# Patient Record
Sex: Female | Born: 1960 | Race: White | Hispanic: No | Marital: Married | Smoking: Never smoker
Health system: Southern US, Community
[De-identification: ages and names within clinical notes are randomized; demographics above are authoritative.]

## PROBLEM LIST (undated history)

## (undated) DIAGNOSIS — R030 Elevated blood-pressure reading, without diagnosis of hypertension: Secondary | ICD-10-CM

## (undated) DIAGNOSIS — E079 Disorder of thyroid, unspecified: Secondary | ICD-10-CM

## (undated) DIAGNOSIS — N915 Oligomenorrhea, unspecified: Secondary | ICD-10-CM

## (undated) DIAGNOSIS — N852 Hypertrophy of uterus: Secondary | ICD-10-CM

## (undated) DIAGNOSIS — E039 Hypothyroidism, unspecified: Secondary | ICD-10-CM

## (undated) DIAGNOSIS — E109 Type 1 diabetes mellitus without complications: Secondary | ICD-10-CM

## (undated) DIAGNOSIS — I10 Essential (primary) hypertension: Secondary | ICD-10-CM

## (undated) DIAGNOSIS — E785 Hyperlipidemia, unspecified: Secondary | ICD-10-CM

## (undated) DIAGNOSIS — N841 Polyp of cervix uteri: Secondary | ICD-10-CM

## (undated) DIAGNOSIS — R011 Cardiac murmur, unspecified: Secondary | ICD-10-CM

## (undated) DIAGNOSIS — D259 Leiomyoma of uterus, unspecified: Secondary | ICD-10-CM

## (undated) HISTORY — DX: Leiomyoma of uterus, unspecified: D25.9

## (undated) HISTORY — DX: Hypertrophy of uterus: N85.2

## (undated) HISTORY — DX: Cardiac murmur, unspecified: R01.1

## (undated) HISTORY — DX: Oligomenorrhea, unspecified: N91.5

## (undated) HISTORY — PX: MYOMECTOMY ABDOMINAL APPROACH: SUR870

## (undated) HISTORY — DX: Elevated blood-pressure reading, without diagnosis of hypertension: R03.0

## (undated) HISTORY — DX: Disorder of thyroid, unspecified: E07.9

## (undated) HISTORY — DX: Type 1 diabetes mellitus without complications: E10.9

## (undated) HISTORY — DX: Polyp of cervix uteri: N84.1

## (undated) HISTORY — DX: Hypothyroidism, unspecified: E03.9

## (undated) HISTORY — DX: Hyperlipidemia, unspecified: E78.5

## (undated) HISTORY — DX: Essential (primary) hypertension: I10

---

## 1998-02-05 ENCOUNTER — Other Ambulatory Visit: Admission: RE | Admit: 1998-02-05 | Discharge: 1998-02-05 | Payer: Self-pay | Admitting: Obstetrics and Gynecology

## 1998-02-25 ENCOUNTER — Ambulatory Visit (HOSPITAL_COMMUNITY): Admission: RE | Admit: 1998-02-25 | Discharge: 1998-02-25 | Payer: Self-pay | Admitting: Obstetrics and Gynecology

## 1998-02-25 ENCOUNTER — Encounter: Payer: Self-pay | Admitting: Obstetrics and Gynecology

## 1998-04-30 ENCOUNTER — Inpatient Hospital Stay (HOSPITAL_COMMUNITY): Admission: RE | Admit: 1998-04-30 | Discharge: 1998-05-02 | Payer: Self-pay | Admitting: Obstetrics and Gynecology

## 1998-12-02 ENCOUNTER — Other Ambulatory Visit: Admission: RE | Admit: 1998-12-02 | Discharge: 1998-12-02 | Payer: Self-pay | Admitting: Obstetrics and Gynecology

## 1998-12-02 ENCOUNTER — Encounter (INDEPENDENT_AMBULATORY_CARE_PROVIDER_SITE_OTHER): Payer: Self-pay | Admitting: Specialist

## 1999-02-17 ENCOUNTER — Other Ambulatory Visit: Admission: RE | Admit: 1999-02-17 | Discharge: 1999-02-17 | Payer: Self-pay | Admitting: Obstetrics and Gynecology

## 1999-07-20 ENCOUNTER — Ambulatory Visit (HOSPITAL_COMMUNITY): Admission: RE | Admit: 1999-07-20 | Discharge: 1999-07-20 | Payer: Self-pay | Admitting: Obstetrics and Gynecology

## 1999-07-20 ENCOUNTER — Encounter: Payer: Self-pay | Admitting: Obstetrics and Gynecology

## 1999-09-01 ENCOUNTER — Encounter: Payer: Self-pay | Admitting: Obstetrics and Gynecology

## 1999-09-01 ENCOUNTER — Ambulatory Visit (HOSPITAL_COMMUNITY): Admission: RE | Admit: 1999-09-01 | Discharge: 1999-09-01 | Payer: Self-pay | Admitting: Obstetrics and Gynecology

## 1999-10-07 ENCOUNTER — Encounter: Payer: Self-pay | Admitting: Obstetrics and Gynecology

## 1999-10-07 ENCOUNTER — Ambulatory Visit (HOSPITAL_COMMUNITY): Admission: RE | Admit: 1999-10-07 | Discharge: 1999-10-07 | Payer: Self-pay | Admitting: Obstetrics and Gynecology

## 1999-11-12 ENCOUNTER — Encounter: Payer: Self-pay | Admitting: Obstetrics & Gynecology

## 1999-11-12 ENCOUNTER — Ambulatory Visit (HOSPITAL_COMMUNITY): Admission: RE | Admit: 1999-11-12 | Discharge: 1999-11-12 | Payer: Self-pay | Admitting: Obstetrics & Gynecology

## 1999-11-19 ENCOUNTER — Ambulatory Visit (HOSPITAL_COMMUNITY): Admission: RE | Admit: 1999-11-19 | Discharge: 1999-11-19 | Payer: Self-pay | Admitting: Obstetrics and Gynecology

## 1999-11-19 ENCOUNTER — Encounter: Payer: Self-pay | Admitting: Obstetrics and Gynecology

## 1999-11-24 ENCOUNTER — Encounter (INDEPENDENT_AMBULATORY_CARE_PROVIDER_SITE_OTHER): Payer: Self-pay | Admitting: Specialist

## 1999-11-24 ENCOUNTER — Inpatient Hospital Stay (HOSPITAL_COMMUNITY): Admission: AD | Admit: 1999-11-24 | Discharge: 1999-11-27 | Payer: Self-pay | Admitting: Obstetrics and Gynecology

## 2000-02-10 ENCOUNTER — Other Ambulatory Visit: Admission: RE | Admit: 2000-02-10 | Discharge: 2000-02-10 | Payer: Self-pay | Admitting: Obstetrics and Gynecology

## 2000-02-24 ENCOUNTER — Encounter: Payer: Self-pay | Admitting: Obstetrics and Gynecology

## 2000-02-24 ENCOUNTER — Ambulatory Visit (HOSPITAL_COMMUNITY): Admission: RE | Admit: 2000-02-24 | Discharge: 2000-02-24 | Payer: Self-pay | Admitting: Obstetrics and Gynecology

## 2001-04-05 ENCOUNTER — Other Ambulatory Visit: Admission: RE | Admit: 2001-04-05 | Discharge: 2001-04-05 | Payer: Self-pay | Admitting: Obstetrics and Gynecology

## 2001-04-17 ENCOUNTER — Ambulatory Visit (HOSPITAL_COMMUNITY): Admission: RE | Admit: 2001-04-17 | Discharge: 2001-04-17 | Payer: Self-pay | Admitting: Obstetrics and Gynecology

## 2001-04-17 ENCOUNTER — Encounter: Payer: Self-pay | Admitting: Obstetrics and Gynecology

## 2002-04-10 ENCOUNTER — Other Ambulatory Visit: Admission: RE | Admit: 2002-04-10 | Discharge: 2002-04-10 | Payer: Self-pay | Admitting: Obstetrics and Gynecology

## 2002-04-23 ENCOUNTER — Ambulatory Visit (HOSPITAL_COMMUNITY): Admission: RE | Admit: 2002-04-23 | Discharge: 2002-04-23 | Payer: Self-pay | Admitting: Obstetrics and Gynecology

## 2002-04-23 ENCOUNTER — Encounter: Payer: Self-pay | Admitting: Obstetrics and Gynecology

## 2003-05-07 ENCOUNTER — Other Ambulatory Visit: Admission: RE | Admit: 2003-05-07 | Discharge: 2003-05-07 | Payer: Self-pay | Admitting: Obstetrics and Gynecology

## 2003-05-09 ENCOUNTER — Ambulatory Visit (HOSPITAL_COMMUNITY): Admission: RE | Admit: 2003-05-09 | Discharge: 2003-05-09 | Payer: Self-pay | Admitting: Obstetrics and Gynecology

## 2004-05-07 ENCOUNTER — Other Ambulatory Visit: Admission: RE | Admit: 2004-05-07 | Discharge: 2004-05-07 | Payer: Self-pay | Admitting: Obstetrics and Gynecology

## 2004-05-13 ENCOUNTER — Ambulatory Visit (HOSPITAL_COMMUNITY): Admission: RE | Admit: 2004-05-13 | Discharge: 2004-05-13 | Payer: Self-pay | Admitting: Obstetrics and Gynecology

## 2005-05-04 ENCOUNTER — Other Ambulatory Visit: Admission: RE | Admit: 2005-05-04 | Discharge: 2005-05-04 | Payer: Self-pay | Admitting: Obstetrics and Gynecology

## 2005-05-12 ENCOUNTER — Ambulatory Visit (HOSPITAL_COMMUNITY): Admission: RE | Admit: 2005-05-12 | Discharge: 2005-05-12 | Payer: Self-pay | Admitting: Obstetrics and Gynecology

## 2006-05-30 ENCOUNTER — Ambulatory Visit (HOSPITAL_COMMUNITY): Admission: RE | Admit: 2006-05-30 | Discharge: 2006-05-30 | Payer: Self-pay | Admitting: Obstetrics and Gynecology

## 2007-06-15 ENCOUNTER — Ambulatory Visit (HOSPITAL_COMMUNITY): Admission: RE | Admit: 2007-06-15 | Discharge: 2007-06-15 | Payer: Self-pay | Admitting: Obstetrics and Gynecology

## 2008-07-19 ENCOUNTER — Ambulatory Visit (HOSPITAL_COMMUNITY): Admission: RE | Admit: 2008-07-19 | Discharge: 2008-07-19 | Payer: Self-pay | Admitting: Obstetrics and Gynecology

## 2009-07-22 ENCOUNTER — Ambulatory Visit (HOSPITAL_COMMUNITY): Admission: RE | Admit: 2009-07-22 | Discharge: 2009-07-22 | Payer: Self-pay | Admitting: Obstetrics and Gynecology

## 2009-11-13 ENCOUNTER — Observation Stay (HOSPITAL_COMMUNITY): Admission: EM | Admit: 2009-11-13 | Discharge: 2009-11-14 | Payer: Self-pay | Admitting: Emergency Medicine

## 2009-11-13 ENCOUNTER — Ambulatory Visit: Payer: Self-pay | Admitting: Family Medicine

## 2009-11-14 ENCOUNTER — Encounter: Payer: Self-pay | Admitting: Family Medicine

## 2010-02-02 ENCOUNTER — Ambulatory Visit: Payer: Self-pay | Admitting: Cardiology

## 2010-02-02 ENCOUNTER — Ambulatory Visit: Payer: Self-pay

## 2010-02-02 DIAGNOSIS — I059 Rheumatic mitral valve disease, unspecified: Secondary | ICD-10-CM | POA: Insufficient documentation

## 2010-02-02 DIAGNOSIS — R55 Syncope and collapse: Secondary | ICD-10-CM

## 2010-02-03 ENCOUNTER — Ambulatory Visit: Payer: Self-pay | Admitting: Cardiology

## 2010-02-04 ENCOUNTER — Telehealth: Payer: Self-pay | Admitting: Cardiology

## 2010-02-11 ENCOUNTER — Ambulatory Visit: Payer: Self-pay | Admitting: Cardiology

## 2010-02-11 ENCOUNTER — Ambulatory Visit: Payer: Self-pay

## 2010-02-11 DIAGNOSIS — R9431 Abnormal electrocardiogram [ECG] [EKG]: Secondary | ICD-10-CM

## 2010-02-11 LAB — CONVERTED CEMR LAB
Direct LDL: 115.1 mg/dL
HDL: 61.4 mg/dL (ref >39.00)
VLDL: 11 mg/dL (ref 0.0–40.0)

## 2010-02-17 ENCOUNTER — Telehealth (INDEPENDENT_AMBULATORY_CARE_PROVIDER_SITE_OTHER): Payer: Self-pay | Admitting: Radiology

## 2010-02-18 ENCOUNTER — Ambulatory Visit: Payer: Self-pay

## 2010-02-18 ENCOUNTER — Encounter: Payer: Self-pay | Admitting: Cardiology

## 2010-02-18 ENCOUNTER — Encounter (HOSPITAL_COMMUNITY)
Admission: RE | Admit: 2010-02-18 | Discharge: 2010-03-24 | Payer: Self-pay | Source: Home / Self Care | Attending: Cardiology | Admitting: Cardiology

## 2010-03-26 NOTE — Procedures (Signed)
Summary: INFO  INFO   Imported By: Mirna Mires 02/09/2010 12:41:22  _____________________________________________________________________  External Attachment:    Type:   Image     Comment:   External Document

## 2010-03-26 NOTE — Progress Notes (Signed)
Summary: monitor ressults  Phone Note Outgoing Call   Call placed by: Katina Dung, RN, BSN,  February 04, 2010 6:10 PM Call placed to: Patient Summary of Call: monitor resutls  Follow-up for Phone Call        Dr Shirlee Latch  reviewed monitor done 02/02/10-02/04/10--PACs and PVCs--mild nocturnal bradycardia, no pauses-nothing to explain syncope--I attempted to notify pt and got pt 's answering machine Luana Shu --[facility] Katina Dung, RN, BSN  February 06, 2010 10:58 AM --pt notified of monitor results

## 2010-03-26 NOTE — Progress Notes (Signed)
Summary: Nuc Pre-Procedure  Phone Note Outgoing Call Call back at Home Phone [phone]   Call placed by: Henrine Screws Call placed to: Patient Reason for Call: Confirm/change Appt Summary of Call: Left message with information on Myoview Information Sheet (see scanned document for details).      Nuclear Med Background Indications for Stress Test: Evaluation for Ischemia  Indications Comments: 9/25/11[facility]- Syncopal episode.Negative enzymes.  History: Echo, GXT  History Comments: PAC's/ PVC's 9/11- Echo- EF= 55-60% 02/11/10- GXT- ST changes and I-LBBB with increased HR.  Symptoms: Syncope    Nuclear Pre-Procedure Cardiac Risk Factors: Family History - CAD, Hypertension, IDDM Type 1 Height (in): 63.5

## 2010-03-26 NOTE — Assessment & Plan Note (Signed)
Summary: Cardiology Nuclear Testing  Nuclear Med Background Indications for Stress Test: Evaluation for Ischemia  Indications Comments: 02/11/10  Abnormal GXT  History: Echo, GXT  History Comments: 9/11Echo:EF=55-60%; 12/21/11GXT:exercise induced ILBBB and NS ST changes  Symptoms: Light-Headedness, Syncope  Symptoms Comments: 11/16/09 syncope:admitted to [facility], negative enzymes.   Nuclear Pre-Procedure Cardiac Risk Factors: Family History - CAD, Hypertension, IDDM Type 1, Lipids Caffeine/Decaff Intake: none NPO After: 11:00 PM Lungs: Clear.  O2 Sat 100% on RA. IV 0.9% NS with Angio Cath: 22g     IV Site: R Hand IV Started by: Cathlyn Parsons, RN Chest Size (in) 34     Cup Size B     Height (in): 63.5 Weight (lb): 130 BMI: 22.75  Nuclear Med Study 1 or 2 day study:  1 day     Stress Test Type:  Eugenie Birks Reading MD:  Olga Millers, MD     Referring MD:  Marca Ancona, MD Resting Radionuclide:  Technetium 97m Tetrofosmin     Resting Radionuclide Dose:  11.0 mCi  Stress Radionuclide:  Technetium 62m Tetrofosmin     Stress Radionuclide Dose:  33.0 mCi   Stress Protocol   Lexiscan: 0.4 mg   Stress Test Technologist:  Rea [employer], CMA-N     Nuclear Technologist:  Domenic Polite, CNMT  Rest Procedure  Myocardial perfusion imaging was performed at rest 45 minutes following the intravenous administration of Technetium 41m Tetrofosmin.  Stress Procedure  The patient received IV Lexiscan 0.4 mg over 15-seconds.  Technetium 26m Tetrofosmin injected at 30-seconds.  There were nonspecific T-wave changes with infusion.  Quantitative spect images were obtained after a 45 minute delay.  QPS Raw Data Images:  Acquisition technically good; normal left ventricular size. Stress Images:  There is decreased uptake in the anterior wall. Rest Images:  There is decreased uptake in the anterior wall. Subtraction (SDS):  No evidence of ischemia. Transient Ischemic Dilatation:  1.01  (Normal  <1.22)  Lung/Heart Ratio:  0.26  (Normal <0.45)  Quantitative Gated Spect Images QGS EDV:  71 ml QGS ESV:  21 ml QGS EF:  71 % QGS cine images:  Normal wall motion.   Overall Impression  Exercise Capacity: Lexiscan with no exercise. BP Response: Normal blood pressure response. Clinical Symptoms: No chest pain ECG Impression: No significant ST segment change suggestive of ischemia. Overall Impression: Normal lexiscan nuclear study with mild soft tissue attenuation but no ischemia.  Appended Document: Cardiology Nuclear Testing probably normal study  Appended Document: Cardiology Nuclear Testing adv Mr. Twaddell of normal results. Claris Gladden, RN, BSN

## 2010-03-26 NOTE — Assessment & Plan Note (Signed)
Summary: NP6/DX:SYNCOPE/BRADICARDIA/LG   Visit Type:  new pt  CC:  no complaints.  History of Present Illness: 50 yo with history of Type I diabetes and HTN presents for evaluation of syncope.  Patient had had 2 episodes of syncope prior to the most recent episode.  Both of these episodes were in the setting of getting blood drawn.  In 9/11, she was at a dentist's office with her daughter.  She went to the bathroom while she was waiting and had a large bowel movement.  5 minutes or so after this, she noted significant lightheadedness.  She got up and went outside for some air but felt no better.  She then went back toward the bathroom but passed out before she got there and slumped along the wall onto the floor.  EMS was called -- her heart rate was in the 40s.  While EMS was there, she became nauseated and vomited.  She then passed out again.  She was taken to the ER.  Blood glucose was 106 not long before the event and was normal when she got to the ER.  She was admitted and had an unremarkable hospital stay.  No events on telemetry.  Echo with normal LV systolic function and mild MR.  She has been doing well since discharge with on recurrent syncope or lightheadedness.  She does 30 minutes of cardiovascular exercise daily (Stairmaster, elliptical, runs on treadmill) with no chest pain or tightness and no dyspnea.  She has never felt palpitations.    ECG: NSR, suspect normal  Labs (9/11): Cardiac enzymes negative x 3, K 3.9, creatinine 1.08, TSH normal  Current Medications (verified): 1)  Humalog 100 Unit/ml Soln (Insulin Lispro (Human)) .... Pump 2)  Benazepril-Hydrochlorothiazide 10-12.5 Mg Tabs (Benazepril-Hydrochlorothiazide) .... Two Times A Day 3)  Synthroid 112 Mcg Tabs (Levothyroxine Sodium) .... Once Daily 4)  Aspirin 81 Mg Tabs (Aspirin) .... Once Daily 5)  Fenoprofen Calcium 600 Mg Tabs (Fenoprofen Calcium) .... Once Daily 6)  One-A-Day Extras Antioxidant  Caps (Multiple  Vitamins-Minerals) .... Once Daily  Allergies (verified): No Known Drug Allergies  Past History:  Past Medical History: 1. Syncopal episode x 3: 2 episodes associated with blood draws. One episode in 9/11 after bowel movement. Echo (9/11): EF 55-60%, probably mild eccentric mitral regurgitation.  2. Type 1 diabetes since childhood.  3. Hypertension.  4. Hypothyroid.  5. History of myomectomy  Family History: Reviewed history from 01/30/2010 and no changes required.  Father deceased from MI at age 70, type 1 diabetes   mellitus.  Sister with type 1 diabetes mellitus, hypertension,   hypothyroidism.  Mother is healthy.      Social History: Reviewed history from 01/30/2010 and no changes required. The patient is married, has one child, currently works as a Passenger transport manager at [employer].  No tobacco, no alcohol, and no  illicit drug use.  Exercises routinely.      Review of Systems       All systems reviewed and negative except as per HPI.   Vital Signs:  Patient profile:   50 year old female Height:      63.5 inches Weight:      133 pounds BMI:     23.27 Pulse rate:   75 / minute BP sitting:   155 / 77  (left arm)  Vitals Entered By: Caralee Ates CMA (February 02, 2010 9:31 AM)  Physical Exam  General:  Well developed, well nourished, in no acute distress. Head:  normocephalic and atraumatic Nose:  no deformity, discharge, inflammation, or lesions Mouth:  Teeth, gums and palate normal. Oral mucosa normal. Neck:  Neck supple, no JVD. No masses, thyromegaly or abnormal cervical nodes. Lungs:  Clear bilaterally to auscultation and percussion. Heart:  Non-displaced PMI, chest non-tender; regular rate and rhythm, S1, S2 without murmurs, rubs or gallops. Carotid upstroke normal, no bruit.  Pedals normal pulses. No edema, no varicosities. Abdomen:  Bowel sounds positive; abdomen soft and non-tender without masses, organomegaly, or hernias noted. No  hepatosplenomegaly. Msk:  Back normal, normal gait. Muscle strength and tone normal. Extremities:  No clubbing or cyanosis. Neurologic:  Alert and oriented x 3. Skin:  Intact without lesions or rashes. Psych:  Normal affect.   Impression & Recommendations:  Problem # 1:  SYNCOPE (ICD-780.2) Patient had a syncopal episode soon after a large bowel movement and again shortly afterwards with emesis.  She had 2 syncopal episodes prior to these in the setting of blood draws.  Inpatient workup was unremarkable.  Telemetry was normal, Echo was unremarkable except for mild mitral regurgitation, cardiac enzymes were negative.  This episode seems most likely to be vasovagal.  However, given her cardiac risk factors (HTN, longstanding DM), I think that getting an ETT to make sure that there is no evidence for ischemia would be reasonable.  I will also have her get a 48 hour holter monitor to assess for arrhythmias.  If this workup is negative, no further evaluation.    Problem # 2:  MITRAL VALVE DISORDERS (ICD-424.0) MR appeared mild on echo in 9/11 but was eccentric, so makes evaluation more difficult.  Would repeat echo in 2 years to make sure that there is no progression.   Problem # 3:  BLOOD PRESSURE BP is running high today but she checks it regularly at home and it has always run 110s-130s.  She will continue to monitor at home.   We will get fasting lipids (has not been done for about 2 years).   Other Orders: Treadmill (Treadmill) Holter Monitor (Holter Monitor)  Patient Instructions: 1)  Your physician recommends that you return for a FASTING lipid profile: 780.2  2)  Your physician has recommended that you wear a holter monitor.  Holter monitors are medical devices that record the heart's electrical activity. Doctors most often use these monitors to diagnose arrhythmias. Arrhythmias are problems with the speed or rhythm of the heartbeat. The monitor is a small, portable device. You can wear  one while you do your normal daily activities. This is usually used to diagnose what is causing palpitations/syncope (passing out). 48 HOUR MONITOR 3)  Your physician has requested that you have an exercise tolerance test.  For further information please visit [email or web address]  Please also follow instruction sheet, as given.    Appended Document: NP6/DX:SYNCOPE/BRADICARDIA/LG faxed to Dr Santiago Bumpers [phone]

## 2010-05-07 LAB — CBC
HCT: 37.4 % (ref 36.0–46.0)
HCT: 44.3 % (ref 36.0–46.0)
Hemoglobin: 12.9 g/dL (ref 12.0–15.0)
MCH: 30.4 pg (ref 26.0–34.0)
MCH: 31.1 pg (ref 26.0–34.0)
MCHC: 34.5 g/dL (ref 30.0–36.0)
MCV: 88 fL (ref 78.0–100.0)
MCV: 90 fL (ref 78.0–100.0)
Platelets: 177 K/uL (ref 150–400)
Platelets: 186 10*3/uL (ref 150–400)
RBC: 4.25 MIL/uL (ref 3.87–5.11)
RDW: 13 % (ref 11.5–15.5)
RDW: 13.1 % (ref 11.5–15.5)
WBC: 8.4 K/uL (ref 4.0–10.5)

## 2010-05-07 LAB — CARDIAC PANEL(CRET KIN+CKTOT+MB+TROPI)
CK, MB: 1.3 ng/mL (ref 0.3–4.0)
Total CK: 62 U/L (ref 7–177)
Troponin I: 0.01 ng/mL (ref 0.00–0.06)

## 2010-05-07 LAB — POCT CARDIAC MARKERS
Myoglobin, poc: 69.3 ng/mL (ref 12–200)
Troponin i, poc: <0.05 ng/mL (ref 0.00–0.09)

## 2010-05-07 LAB — BASIC METABOLIC PANEL
BUN: 14 mg/dL (ref 6–23)
CO2: 26 mEq/L (ref 19–32)
Chloride: 105 mEq/L (ref 96–112)
GFR calc Af Amer: 57 mL/min — ABNORMAL LOW (ref >60)
GFR calc Af Amer: >60 mL/min (ref >60)
GFR calc non Af Amer: 54 mL/min — ABNORMAL LOW (ref >60)
Glucose, Bld: 231 mg/dL — ABNORMAL HIGH (ref 70–99)
Potassium: 3.9 mEq/L (ref 3.5–5.1)
Sodium: 137 mEq/L (ref 135–145)

## 2010-05-07 LAB — GLUCOSE, CAPILLARY
Glucose-Capillary: 107 mg/dL — ABNORMAL HIGH (ref 70–99)
Glucose-Capillary: 224 mg/dL — ABNORMAL HIGH (ref 70–99)
Glucose-Capillary: 243 mg/dL — ABNORMAL HIGH (ref 70–99)
Glucose-Capillary: 268 mg/dL — ABNORMAL HIGH (ref 70–99)

## 2010-05-07 LAB — TROPONIN I: Troponin I: 0.01 ng/mL (ref 0.00–0.06)

## 2010-05-07 LAB — DIFFERENTIAL
Basophils Relative: 0 % (ref 0–1)
Eosinophils Absolute: 0 10*3/uL (ref 0.0–0.7)
Neutro Abs: 14.2 10*3/uL — ABNORMAL HIGH (ref 1.7–7.7)
Neutrophils Relative %: 86 % — ABNORMAL HIGH (ref 43–77)

## 2010-05-07 LAB — CK TOTAL AND CKMB (NOT AT ARMC)
CK, MB: 1.4 ng/mL (ref 0.3–4.0)
Total CK: 73 U/L (ref 7–177)

## 2010-05-07 LAB — TSH: TSH: 2.546 u[IU]/mL (ref 0.350–4.500)

## 2010-06-20 ENCOUNTER — Other Ambulatory Visit: Payer: Self-pay | Admitting: Cardiology

## 2010-06-20 DIAGNOSIS — E785 Hyperlipidemia, unspecified: Secondary | ICD-10-CM

## 2010-06-23 ENCOUNTER — Other Ambulatory Visit (HOSPITAL_COMMUNITY): Payer: Self-pay | Admitting: Obstetrics and Gynecology

## 2010-06-23 DIAGNOSIS — Z1231 Encounter for screening mammogram for malignant neoplasm of breast: Secondary | ICD-10-CM

## 2010-07-10 NOTE — H&P (Signed)
[facility] [place]  Patient:    Brittney [name], Brittney [name]                         MRN: [id number] Proc. Date: 11/24/99 Adm. Date:  [id number] Attending:  Shaune Spittle Dictator:   Vance Gather Duplantis, C.N.M.                         History and Physical  HISTORY OF PRESENT ILLNESS:   Brittney [name] is a 50 year old married white female, gravida 1, para 0 at [redacted] week gestation who presents for cesarean section delivery secondary to malpresentation.  She denies any nausea, vomiting, headaches or visual disturbances.  She denies any leaking or vaginal bleeding or any regular uterine contractions.  She reports positive fetal movement.  PRENATAL COURSE:              Her pregnancy has been followed at [facility] and has been at risk for type 1 diabetes, hypertension, advanced maternal age, hypothyroidism, and fibroids with a history of a myomectomy in the past.  OBSTETRIC/GYNCOLOGIC HISTORY:                      She is a primigravida.  She has also had a history of a myomectomy in March of 2000 and her last menstrual period was March 07, 1999, which gave her a due date of December 12, 1999.  ALLERGIES:                    She has no known drug allergies.  PAST MEDICAL HISTORY:         She reports having had the usual childhood diseases.  She reports a history of chronic hypertension but not requiring medication and a history of diabetes since age 69, and has been treated with an insulin pump throughout this pregnancy.  She also has a history of hypothyroidism and has been on Synthroid.  FAMILY HISTORY:               Significant for her father with an MI.  Other siblings with chronic hypertension, a twin sister with insulin-dependent diabetes since age 76, and a twin sister also with hypothyroidism.  GENETIC HISTORY:              Significant for the fact that she is greater than age 86 and that she herself is a twin.  SOCIAL HISTORY:                She is married to [place] who is involved and supportive.  They are both employed full-time.  She is of the [employer] and denies any illicit drug use, alcohol, or smoking with this pregnancy.  PRENATAL LABORATORY DATA:     Her blood type is A positive.  Her antibody screen is negative.  Her syphilis is negative.  Her rubella is immune. Hepatitis B surface antigen is negative.  HIV is declined.  TSH throughout her pregnancy were stable.  GC and Chlamydia are both negative and GBS testing was within normal limits and beta strep is currently unavailable.  PHYSICAL EXAMINATION:  VITAL SIGNS:                  Her vital signs are stable.  She is afebrile.  HEENT:  Grossly within normal limits.  HEART:                        Regular rhythm and rate.  CHEST:                        Clear.  BREASTS:                      Soft and nontender.  ABDOMEN:                      Gravid with irregular uterine contractions.  Her fetal heart rate is reassuring.  The baby is in a transverse lie position.  PELVIC:                       Examination deferred.  EXTREMITIES:                  Within normal limits.  ASSESSMENT:                   1. Intrauterine pregnancy, at term.                               2. Insulin-dependent diabetes prior to                                  pregnancy.                               3. Transverse lie or malpresentation.  PLAN:                         To admit to [facility] for cesarean section delivery per Dr. Dierdre Forth. DD:  11/24/99 TD:  11/24/99 Job: [id number]

## 2010-07-10 NOTE — Discharge Summary (Signed)
[facility] [place]  Patient:    JOSSELIN, GAULIN                         MRN: [id number] Adm. Date:  [id number] Disc. Date: [id number] Attending:  Shaune Spittle Dictator:   Nigel Bridgeman, C.N.M.                           Discharge Summary  ADMISSION DIAGNOSES:          1. Intrauterine pregnancy at 37-4/7 weeks.                               2. Unstable lie.                               3. Type 1 diabetes.                               4. Chronic high blood pressure.                               5. Hypothyroidism.                               6. History of myomectomy.  DISCHARGE DIAGNOSES:          1. Intrauterine pregnancy at 37-4/7 weeks.                               2. Unstable lie.                               3. Type 1 diabetes.                               4. Chronic high blood pressure.                               5. Hypothyroidism.                               6. History of myomectomy.                               7. Superimposed preeclampsia.  PROCEDURES:                   1. Primary low transverse cesarean section.                               2. Spinal anesthesia.                               3. Magnesium sulfate therapy.  HOSPITAL COURSE:              Ms. Milroy  is a 50 year old, gravida 1, para 0 at 37-4/7 weeks who was admitted on November 24, 1999, for cesarean section secondary to unstable lie, increasing blood pressures, decreasing insulin requirements, and chronic high blood pressure.  On insertion of Foley preop, the patient had 30 mg of protein; postop, she had greater than 100 mg of protein.  Platelets were 154.  The patient tolerated the procedure well with blood pressures in the 160s to 180s in her postpartum time, but she was started on magnesium sulfate for preeclampsia.  She also was on an insulin drip during the course of her care.  Findings were a viable female female by the name of [name].  Weight 6 pounds 12  ounces.  Apgars were 8/9.  The infant was taken to the NICU secondary to blood sugar instability.  Estimated blood loss was 750 cc during surgery.  There were no significant complications.  On postop day #1, the patient was doing well.  Infant was stable in the NICU. Blood pressures were 145 to 165 systolic, 65 to 80 diastolic.  She was afebrile.  Her CBG was 176 at 6 a.m.  She had started her insulin pump at approximately 12 midnight prior to that time.  Urine output began to increase at approximately 2 a.m. that morning.  Her physical exam was within normal limits.  Her hemoglobin was 12.2, platelets 149.   Her incision was clean, dry, and intact.  Dr. Pennie Rushing saw the patient that morning, and discontinued her IV glucose.  Magnesium was discontinued at approximately 2 p.m. that afternoon which was 24 hours after delivery.  She was then transferred to the regular mother-baby floor.  She had elected to bottle feed.  Nutritional consult was held secondary to insulin-dependent diabetes.  By postop day #2, temperature max had been 100.3.  Blood pressures were stabilizing.  A basic metabolic panel was done which was essentially normal except for sodium of 129.  Her incision was clean, dry, and intact.  Accupril 10 mg 1 p.o. q.d. and HCTZ 12.5 mg 1 p.o. q.d. were started for blood pressure control.  By postop day #3, the patient was doing well.  Her CBGs were stabilizing. Dr. Pennie Rushing saw the patient and elected to maintain the patients staples until December 01, 1999.  The patient was deemed to have received full benefit from her hospital stay and was discharged to home.  Infant remained in NICU at present.  DISCHARGE INSTRUCTIONS:       Per [facility] handout.  DISCHARGE MEDICATIONS:        1. Accupril 10 mg 1 p.o. q.d. x 6 weeks.                               2. HCTZ 25 mg 1/2 tablet q.d. x 1 week.                               3. Motrin 600 p.o. q.6h. p.r.n. pain.                                4. Tylox 1 to 2 p.o. q.3-4h. p.r.n. pain.  DISCHARGE FOLLOWUP:           December 01, 1999, in the office at 2 p.m. for staple removal and blood pressure check.  Postpartum exam will  be in six weeks at [facility]. DD:  11/27/99 TD:  11/28/99 Job: [id number]

## 2010-07-10 NOTE — Op Note (Signed)
[facility] [place]  Patient:    Brittney [name], Brittney [name]                         MRN: [id number] Proc. Date: 11/24/99 Adm. Date:  [id number] Attending:  Dierdre Forth [place]                           Operative Report  PREOPERATIVE DIAGNOSES:       1. Intrauterine pregnancy at 37-4/[redacted] weeks                                  gestation.                               2. Unstable fetal lie.                               3. Type 1 diabetes mellitus with decreasing                                  insulin requirement.                               4. Chronic hypertension with increasing                                  blood pressures.                               5. Hypothyroidism.                               6. Status post myomectomy.  POSTOPERATIVE DIAGNOSES:      1. Intrauterine pregnancy at 37-4/[redacted] weeks                                  gestation.                               2. Unstable fetal lie.                               3. Type 1 diabetes mellitus with decreasing                                  insulin requirement.                               4. Chronic hypertension with increasing                                  blood pressures.  5. Hypothyroidism.                               6. Status post myomectomy.  OPERATION:                    Primary low transverse cesarean section.  SURGEON:                      Vanessa P. Pennie Rushing, M.D.  ASSISTANTVance Gather Duplantis, C.N.M.  ANESTHESIA:                   Spinal anesthesia.  ESTIMATED BLOOD LOSS:         750 cc.  COMPLICATIONS:                None.  FINDINGS:                     The patient was delivered of a female infant whose name is Brittney [name], weighing 6 pounds 12 ounces with Apgars of 8 and 9 at one and five minutes, respectively.  The uterus, tubes and ovaries were normal for the gravid state.  PREOPERATIVE DISCUSSION:      The patient was  addressed in the preoperative area and a confirmatory ultrasound performed.  She had been evaluated in the office on November 23, 1999 and examination at that time confirmed transverse lie which had been diagnosed on November 19, 1999, on ultrasound.  At the time of the ultrasound this morning, the infant seemed to be in the vertex position. A long discussion was held with the patient and her husband concerning the continued indication for delivery being increasing blood pressures and decreasing insulin requirement at near term and the possibility that labor could be induced now that the infant appeared in the vertex position. The parents considered carefully the risks of cesarean section which include, but are not limited to, anesthesia, bleeding, infection, and damage to adjacent organs.  They also considered the risk of induction of labor including Pitocin induced hyperstimulation, fetal distress, and failed induction with the need for cesarean section. After careful consideration of the risks and benefits, the patient and her husband wish to proceed with cesarean section because of the instability of the fetal lie as well as concerns about the other risks of induction with vaginal delivery.  DESCRIPTION OF PROCEDURE:     The patient was thus taken to the operating room after appropriate identification and discussion and placed on the operating table.  A spinal anesthetic was placed and she was placed in the supine position with a left lateral tilt. The abdomen and perineum were prepped with multiple layers of Betadine and a Foley catheter inserted into the bladder and connected to straight drainage.  The abdomen was draped as a sterile field. After the assurance of adequate anesthesia, a transverse incision was made in the abdomen and the old scar from the myomectomy excised.  The abdomen was then opened in layers and the peritoneum entered without difficulty.  The visceral peritoneum  overlying the uterus was incised and that incision taken laterally on either side.  The bladder flap was bluntly developed and the bladder blade placed.  The uterus was incised in the midline and that incision taken laterally on either side. The infant was delivered  from the occiput transverse position with the aid of a Mityvac vacuum extractor and, after having the nares and pharynx suctioned and the cord clamped and cut, was handed off to the awaiting pediatricians. The placenta was manually removed from the uterus insuring that all products of conception were removed.  The uterine incision was then closed with running interlocking sutures of 0 Vicryl.  An imbricating suture of 0 Vicryl was placed.  Two hemostatic sutures allowed for adequate hemostasis.  Irrigation was carried out and the bladder flap appeared with a single figure-of-eight suture in the midline of 3-0 Vicryl.  Copious irrigation was carried out and hemostasis was noted to be adequate. The abdominal peritoneum was closed with running sutures of 2-0 Vicryl. The rectus muscles were reapproximated in the midline with figure-of-eight sutures of 2-0 Vicryl.  The rectus muscles were irrigated and noted to be hemostatic. The rectus fascia was closed with a running suture of 0 Vicryl then reinforced on either side of midline with figure-of-eight sutures of 0 Vicryl.  Subcutaneous tissue was made hemostatic with Bovie cautery and irrigated.  Skin incision was closed with skin staples.  Sterile dressing was applied and the patient was taken from the operating room to the recovery room in satisfactory condition having tolerated the procedure well with sponge and instrument counts correct. DD:  11/24/99 TD:  11/24/99 Job: [id number]

## 2010-08-03 ENCOUNTER — Ambulatory Visit (HOSPITAL_COMMUNITY)
Admission: RE | Admit: 2010-08-03 | Discharge: 2010-08-03 | Disposition: A | Discharge status: Home / Self Care | Payer: BC Managed Care – PPO | Source: Ambulatory Visit | Attending: Obstetrics and Gynecology | Admitting: Obstetrics and Gynecology

## 2010-08-03 DIAGNOSIS — Z1231 Encounter for screening mammogram for malignant neoplasm of breast: Secondary | ICD-10-CM | POA: Insufficient documentation

## 2010-08-05 ENCOUNTER — Ambulatory Visit (AMBULATORY_SURGERY_CENTER): Payer: BC Managed Care – PPO | Admitting: *Deleted

## 2010-08-05 ENCOUNTER — Encounter: Payer: Self-pay | Admitting: Internal Medicine

## 2010-08-05 ENCOUNTER — Telehealth: Payer: Self-pay | Admitting: *Deleted

## 2010-08-05 VITALS — Ht 63.5 in | Wt 134.0 lb

## 2010-08-05 DIAGNOSIS — Z1211 Encounter for screening for malignant neoplasm of colon: Secondary | ICD-10-CM

## 2010-08-05 MED ORDER — PEG-KCL-NACL-NASULF-NA ASC-C 100 G PO SOLR: ORAL | Status: DC

## 2010-08-05 NOTE — Telephone Encounter (Signed)
Ms. Back is having a colon 08/19/10 at 10:30am with Dr. Marina Goodell and has an insulin pump.  Her Endocrinologist is at [facility]  His name is Clare Charon and his NP is Margaretmary Lombard.  [phone] Wyona Almas

## 2010-08-07 ENCOUNTER — Encounter: Payer: Self-pay | Admitting: *Deleted

## 2010-08-07 NOTE — Telephone Encounter (Signed)
Letter sent to Dr. Vicente Males.  Reminder to check on it on 08/14/10.

## 2010-08-12 ENCOUNTER — Telehealth: Payer: Self-pay

## 2010-08-12 NOTE — Telephone Encounter (Signed)
Reviewed insulin pump instructions with pt per Dr. Vicente Males.   1) Insulin pump to stay on 2) Monitor bloodsugars pre and post porocedure 3) If bloodsugar <150 temporary basal to 50% during the procedure 4) Only bolus once restart po solid food 5) Day before procedure regular basal rate but will probably need to half bolus amt pre liquid intake  Pt verbalized understanding, gave pt phone number to call if questions. Margaretmary Lombard [phone] with Dr. Vicente Males.

## 2010-08-19 ENCOUNTER — Ambulatory Visit (AMBULATORY_SURGERY_CENTER): Payer: BC Managed Care – PPO | Admitting: Internal Medicine

## 2010-08-19 ENCOUNTER — Encounter: Payer: Self-pay | Admitting: Internal Medicine

## 2010-08-19 VITALS — BP 96/48 | HR 88 | Temp 97.6°F | Resp 20 | Ht 63.0 in | Wt 134.0 lb

## 2010-08-19 DIAGNOSIS — Z1211 Encounter for screening for malignant neoplasm of colon: Secondary | ICD-10-CM

## 2010-08-19 DIAGNOSIS — D126 Benign neoplasm of colon, unspecified: Secondary | ICD-10-CM

## 2010-08-19 LAB — GLUCOSE, CAPILLARY: Glucose-Capillary: 271 mg/dL — ABNORMAL HIGH (ref 70–99)

## 2010-08-19 MED ORDER — SODIUM CHLORIDE 0.9 % IV SOLN: 500.0000 mL | INTRAVENOUS | Status: DC

## 2010-08-19 NOTE — Patient Instructions (Signed)
Findings:  Polyps  Recommendations:  Repeat colonoscopy in 5 Years

## 2010-08-20 ENCOUNTER — Telehealth: Payer: Self-pay | Admitting: *Deleted

## 2010-08-20 NOTE — Telephone Encounter (Signed)

## 2010-10-23 ENCOUNTER — Other Ambulatory Visit: Payer: Self-pay | Admitting: Cardiology

## 2011-03-02 ENCOUNTER — Other Ambulatory Visit: Payer: Self-pay | Admitting: Cardiology

## 2011-04-10 ENCOUNTER — Other Ambulatory Visit: Payer: Self-pay | Admitting: Obstetrics and Gynecology

## 2011-07-14 ENCOUNTER — Other Ambulatory Visit: Payer: Self-pay | Admitting: Obstetrics and Gynecology

## 2011-07-14 DIAGNOSIS — Z1231 Encounter for screening mammogram for malignant neoplasm of breast: Secondary | ICD-10-CM

## 2011-07-20 ENCOUNTER — Other Ambulatory Visit: Payer: Self-pay | Admitting: Cardiology

## 2011-08-10 ENCOUNTER — Ambulatory Visit (HOSPITAL_COMMUNITY)
Admission: RE | Admit: 2011-08-10 | Discharge: 2011-08-10 | Disposition: A | Discharge status: Home / Self Care | Payer: BC Managed Care – PPO | Source: Ambulatory Visit | Attending: Obstetrics and Gynecology | Admitting: Obstetrics and Gynecology

## 2011-08-10 DIAGNOSIS — Z1231 Encounter for screening mammogram for malignant neoplasm of breast: Secondary | ICD-10-CM | POA: Insufficient documentation

## 2011-09-14 ENCOUNTER — Encounter: Payer: Self-pay | Admitting: Obstetrics and Gynecology

## 2011-10-15 DIAGNOSIS — E039 Hypothyroidism, unspecified: Secondary | ICD-10-CM | POA: Insufficient documentation

## 2011-10-15 DIAGNOSIS — N841 Polyp of cervix uteri: Secondary | ICD-10-CM | POA: Insufficient documentation

## 2011-10-15 DIAGNOSIS — I10 Essential (primary) hypertension: Secondary | ICD-10-CM | POA: Insufficient documentation

## 2011-10-15 DIAGNOSIS — R030 Elevated blood-pressure reading, without diagnosis of hypertension: Secondary | ICD-10-CM | POA: Insufficient documentation

## 2011-10-15 DIAGNOSIS — N852 Hypertrophy of uterus: Secondary | ICD-10-CM | POA: Insufficient documentation

## 2011-10-15 DIAGNOSIS — D259 Leiomyoma of uterus, unspecified: Secondary | ICD-10-CM | POA: Insufficient documentation

## 2011-10-15 DIAGNOSIS — E079 Disorder of thyroid, unspecified: Secondary | ICD-10-CM | POA: Insufficient documentation

## 2011-10-15 DIAGNOSIS — E785 Hyperlipidemia, unspecified: Secondary | ICD-10-CM | POA: Insufficient documentation

## 2011-10-15 DIAGNOSIS — N915 Oligomenorrhea, unspecified: Secondary | ICD-10-CM | POA: Insufficient documentation

## 2011-10-15 DIAGNOSIS — E109 Type 1 diabetes mellitus without complications: Secondary | ICD-10-CM | POA: Insufficient documentation

## 2011-10-21 ENCOUNTER — Encounter: Payer: Self-pay | Admitting: Obstetrics and Gynecology

## 2011-10-21 ENCOUNTER — Ambulatory Visit (INDEPENDENT_AMBULATORY_CARE_PROVIDER_SITE_OTHER): Payer: BC Managed Care – PPO | Admitting: Obstetrics and Gynecology

## 2011-10-21 VITALS — BP 132/74 | Ht 63.5 in | Wt 135.0 lb

## 2011-10-21 DIAGNOSIS — Z124 Encounter for screening for malignant neoplasm of cervix: Secondary | ICD-10-CM

## 2011-10-21 NOTE — Progress Notes (Signed)
Subjective:  AEX:  Last Pap: 07/22/2010 WNL: Yes Regular Periods:no Contraception: Post-Menopausal  Monthly Breast exam:yes Tetanus<31yrs:yes Nl.Bladder Function:yes Daily BMs:yes Healthy Diet:yes Calcium:yes Mammogram:yes Date of Mammogram: 09/11/2011 Exercise:yes Have often Exercise: 6 times weekly Seatbelt: yes Abuse at home: no Stressful work:no Sigmoid-colonoscopy: 07/2010 Bone Density: Yes 5 years ago PCP: Dr. Santiago Bumpers / [facility] Change in PMH: None Change in [facility]: None    Brittney [name] is a 51 y.o. female G1P1 who presents for annual exam.  The patient has no complaints today.   The following portions of the patient's history were reviewed and updated as appropriate: allergies, current medications, past family history, past medical history, past social history, past surgical history and problem list.  Review of Systems Pertinent items are noted in HPI. Gastrointestinal:No change in bowel habits, no abdominal pain, no rectal bleeding Genitourinary:negative for dysuria, frequency, hematuria, nocturia and urinary incontinence    Objective:     BP 132/74  Ht 5' 3.5" (1.613 m)  Wt 135 lb (61.236 kg)  BMI 23.54 kg/m2  Weight:  Wt Readings from Last 1 Encounters:  10/21/11 135 lb (61.236 kg)     BMI: Body mass index is 23.54 kg/(m^2). General Appearance: Alert, appropriate appearance for age. No acute distress HEENT: Grossly normal Neck / Thyroid: Supple, no masses, nodes or enlargement Lungs: clear to auscultation bilaterally Back: No CVA tenderness Breast Exam: No masses or nodes.No dimpling, nipple retraction or discharge. Cardiovascular: Regular rate and rhythm. S1, S2, no murmur Gastrointestinal: Soft, non-tender, no masses or organomegaly Pelvic Exam: External genitalia: normal general appearance Vaginal: atrophic mucosa Cervix: normal appearance Adnexa: non palpable Uterus: ULNS Rectovaginal: normal rectal, no masses Lymphatic Exam: Non-palpable  nodes in neck, clavicular, axillary, or inguinal regions Skin: no rash or abnormalities Neurologic: Normal gait and speech, no tremor  Psychiatric: Alert and oriented, appropriate affect.    Urinalysis:Not done    Assessment:    HX fibroids, stable and asymptomatic    Plan:   mammogram pap smear with HR HPV return annually or prn Follow-up:  for annual exam

## 2011-10-26 LAB — PAP IG AND HPV HIGH-RISK: HPV DNA High Risk: NOT DETECTED

## 2011-12-02 ENCOUNTER — Other Ambulatory Visit: Payer: Self-pay | Admitting: Cardiology

## 2012-04-05 ENCOUNTER — Ambulatory Visit (INDEPENDENT_AMBULATORY_CARE_PROVIDER_SITE_OTHER): Payer: 59 | Admitting: Physician Assistant

## 2012-04-05 VITALS — BP 130/80 | HR 62 | Temp 99.3°F | Resp 14 | Ht 64.0 in | Wt 134.6 lb

## 2012-04-05 DIAGNOSIS — E119 Type 2 diabetes mellitus without complications: Secondary | ICD-10-CM

## 2012-04-05 DIAGNOSIS — J019 Acute sinusitis, unspecified: Secondary | ICD-10-CM

## 2012-04-05 DIAGNOSIS — R05 Cough: Secondary | ICD-10-CM

## 2012-04-05 MED ORDER — BENZONATATE 200 MG PO CAPS: 200.0000 mg | ORAL_CAPSULE | Freq: Three times a day (TID) | ORAL | Status: DC | PRN

## 2012-04-05 MED ORDER — AMOXICILLIN-POT CLAVULANATE 875-125 MG PO TABS: 1.0000 | ORAL_TABLET | Freq: Two times a day (BID) | ORAL | Status: DC

## 2012-04-05 MED ORDER — IPRATROPIUM BROMIDE 0.06 % NA SOLN: 2.0000 | Freq: Three times a day (TID) | NASAL | Status: DC

## 2012-04-05 NOTE — Progress Notes (Signed)
Patient ID: Brittney [name] MRN: [id number], DOB: 12-03-1960, 52 y.o. Date of Encounter: 04/05/2012, 11:47 AM  Primary Physician: Pcp Not In System  Chief Complaint:  Chief Complaint  Patient presents with  . Sore Throat    1 week  . Sinusitis  . Cough    HPI: 52 y.o. year old female presents with a seven day history of nasal congestion, post nasal drip, sore throat, sinus pressure, and cough. Afebrile. No chills. Nasal congestion thick and green/yellow. Sinus pressure is the worst symptom. Cough is productive secondary to post nasal drip and not associated with time of day. Ears feel full, leading to sensation of muffled hearing. Ears are popping. Has tried OTC cold preps without success. No GI complaints. Appetite normal. Husband and daughter were sick with similar symptoms the previous week, treated and now they feel better. No recent travels. No leg trauma, sedentary periods, h/o cancer, or tobacco use.  Insulin dependent diabetes followed by endocrinologist at [facility]. Has seen this endocrinologist since she was in undergrad there. Typically at home sugars are well controlled, however with her above illness she notes they have been around 200. She last saw her endocrinologist in December.   Past Medical History  Diagnosis Date  . Diabetes mellitus     has insulin pump  . Hyperlipidemia   . Hypertension   . Thyroid disease   . Uterine enlargement   . Type 1 diabetes mellitus   . Hypothyroidism   . Uterine fibroid   . Endocervical polyp   . Increased blood pressure (not hypertension)   . Oligomenorrhea      Home Meds: Prior to Admission medications   Medication Sig Start Date End Date Taking? Authorizing Provider  aspirin 81 MG tablet Take 81 mg by mouth daily.     Yes Historical Provider, MD  benazepril-hydrochlorthiazide (LOTENSIN HCT) 10-12.5 MG per tablet Take 1 tablet by mouth Twice daily. 06/29/10  Yes Historical Provider, MD  calcium carbonate (OS-CAL) 600 MG  TABS Take 600 mg by mouth daily.     Yes Historical Provider, MD  HUMALOG 100 UNIT/ML injection Inject into the skin Continuous. Via pump 07/22/10  Yes Historical Provider, MD  Multiple Vitamins-Minerals (MULTIVITAMIN WITH MINERALS) tablet Take 1 tablet by mouth daily.     Yes Historical Provider, MD  pravastatin (PRAVACHOL) 20 MG tablet TAKE 1 TABLET EVERY EVENING 12/02/11  Yes Laurey Morale, MD  SYNTHROID 112 MCG tablet Take 1 tablet by mouth Daily. 06/29/10  Yes Historical Provider, MD                         Allergies: No Known Allergies  History   Social History  . Marital Status: Married    Spouse Name: N/A    Number of Children: N/A  . Years of Education: N/A   Occupational History  . Not on file.   Social History Main Topics  . Smoking status: Never Smoker   . Smokeless tobacco: Never Used  . Alcohol Use: No  . Drug Use: No  . Sexually Active: Yes    Birth Control/ Protection: Post-menopausal   Other Topics Concern  . Not on file   Social History Narrative  . No narrative on file     Review of Systems: Constitutional: Positive for fatigue. Negative for chills, fever, night sweats or weight changes HEENT: see above Cardiovascular: negative for chest pain or palpitations Respiratory: Positive for cough. Negative for hemoptysis,  wheezing, or shortness of breath Abdominal: negative for abdominal pain, nausea, vomiting or diarrhea Dermatological: negative for rash Neurologic: Positive for headache. Negative for dizziness or vertigo   Physical Exam: Blood pressure 130/80, pulse 62, temperature 99.3 F (37.4 C), temperature source Oral, resp. rate 14, height 5\' 4"  (1.626 m), weight 134 lb 9.6 oz (61.054 kg), SpO2 99.00%., Body mass index is 23.09 kg/(m^2). General: Well developed, well nourished, in no acute distress. Head: Normocephalic, atraumatic, eyes without discharge, sclera non-icteric, nares are congested. Bilateral auditory canals clear, TM's are without  perforation, pearly grey with reflective cone of light bilaterally. Serous effusion bilaterally behind TM's. Maxillary sinus TTP. Oral cavity moist, dentition normal. Posterior pharynx with post nasal drip and mild erythema. No peritonsillar abscess or tonsillar exudate. Uvula midline. Neck: Supple. No thyromegaly. Full ROM. No lymphadenopathy. Lungs: Clear bilaterally to auscultation without wheezes, rales, or rhonchi. Breathing is unlabored.  Heart: RRR with S1 S2. No murmurs, rubs, or gallops appreciated. Msk:  Strength and tone normal for age. Extremities: No clubbing or cyanosis. No edema. Neuro: Alert and oriented X 3. Moves all extremities spontaneously. CNII-XII grossly in tact. Psych:  Responds to questions appropriately with a normal affect.     ASSESSMENT AND PLAN:  52 y.o. female with sinusitis, cough secondary to post nasal drip, and IDDM  1) Sinusitis and cough -Augmentin 875/125 mg 1 po bid #20 no RF -Atrovent NS 0.06% 2 sprays each nare bid prn #1 no RF -Tessalon Perles 200 mg 1 po tid prn cough #30 no RF  -Mucinex -Tylenol/Motrin prn -Rest/fluids -RTC precautions -RTC 3-5 days if no improvement  2) IDDM -Monitor blood sugars -Should start to improve as illness resolves -Follow up with [facility] endocrinologist as directed  Signed, Eula Listen, PA-C 04/05/2012 11:47 AM

## 2012-04-11 ENCOUNTER — Other Ambulatory Visit: Payer: Self-pay | Admitting: Cardiology

## 2012-05-22 ENCOUNTER — Other Ambulatory Visit: Payer: Self-pay | Admitting: Cardiology

## 2012-07-04 ENCOUNTER — Other Ambulatory Visit: Payer: Self-pay | Admitting: Obstetrics and Gynecology

## 2012-07-04 DIAGNOSIS — Z1231 Encounter for screening mammogram for malignant neoplasm of breast: Secondary | ICD-10-CM

## 2012-08-14 ENCOUNTER — Ambulatory Visit (HOSPITAL_COMMUNITY)
Admission: RE | Admit: 2012-08-14 | Discharge: 2012-08-14 | Disposition: A | Discharge status: Home / Self Care | Payer: 59 | Source: Ambulatory Visit | Attending: Obstetrics and Gynecology | Admitting: Obstetrics and Gynecology

## 2012-08-14 DIAGNOSIS — Z1231 Encounter for screening mammogram for malignant neoplasm of breast: Secondary | ICD-10-CM | POA: Insufficient documentation

## 2012-11-27 ENCOUNTER — Ambulatory Visit (INDEPENDENT_AMBULATORY_CARE_PROVIDER_SITE_OTHER): Payer: 59 | Admitting: Family Medicine

## 2012-11-27 VITALS — BP 122/78 | HR 68 | Temp 98.7°F | Resp 17 | Ht 64.0 in | Wt 135.0 lb

## 2012-11-27 DIAGNOSIS — R05 Cough: Secondary | ICD-10-CM

## 2012-11-27 DIAGNOSIS — J019 Acute sinusitis, unspecified: Secondary | ICD-10-CM

## 2012-11-27 MED ORDER — AMOXICILLIN-POT CLAVULANATE 875-125 MG PO TABS: 1.0000 | ORAL_TABLET | Freq: Two times a day (BID) | ORAL | Status: DC

## 2012-11-27 MED ORDER — BENZONATATE 200 MG PO CAPS: 200.0000 mg | ORAL_CAPSULE | Freq: Three times a day (TID) | ORAL | Status: DC | PRN

## 2012-11-27 NOTE — Progress Notes (Signed)
[facility] [address] [place] [address] [phone]  Date:  11/27/2012   Name:  Brittney [name]   DOB:  Jul 16, 1960   MRN:  [id number]  PCP:  Pcp Not In System    Chief Complaint: Sore Throat and Sinusitis   History of Present Illness:  Brittney [name] is a 52 y.o. very pleasant female patient who presents with the following:  Here today with illness.  She has noted a cough- especaily at night for about one week.  She has lost her voice, and has had a ST.  She has noted a lot of PND. She thinks the PND is what is causing her cough because she otherwise does not feel any sx in her chest.  No wheezing or chest congestion  She has not noted fever, chills or aches.  No sneezing or runny nose.  She has noted some pain and pressure in her sinuses- if she bends down it is painful.   She has tried tylenol cold.  She did have a left over rx for tessalon perles which help some  She uses an insulin pump for her DM1.  Her endocrinologist is at [facility].  She was dx with DM at age 51 years old.   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 Hyperlipidemia   . Hypertension   . Thyroid disease   . Uterine enlargement   . Type 1 diabetes mellitus   . Hypothyroidism   . Uterine fibroid   . Endocervical polyp   . Increased blood pressure (not hypertension)   . Oligomenorrhea   . ABNORMAL STRESS ELECTROCARDIOGRAM 02/11/2010  . MITRAL VALVE DISORDERS 02/02/2010  . SYNCOPE 02/02/2010    Past Medical History  Diagnosis Date  . Diabetes mellitus     has insulin pump  . Hyperlipidemia   . Hypertension   . Thyroid disease   . Uterine enlargement   . Type 1 diabetes mellitus   . Hypothyroidism   . Uterine fibroid   . Endocervical polyp   . Increased blood pressure (not hypertension)   . Oligomenorrhea     Past Surgical History  Procedure Laterality Date  . Cesarean section     . Myomectomy abdominal approach      History  Substance Use Topics  . Smoking status: Never Smoker   . Smokeless tobacco: Never Used  . Alcohol Use: No    Family History  Problem Relation Age of Onset  . Diabetes Father   . Hypertension Father   . Diabetes Paternal Grandfather   . Diabetes Maternal Grandfather     No Known Allergies  Medication list has been reviewed and updated.  Current Outpatient Prescriptions on File Prior to Visit  Medication Sig Dispense Refill  . aspirin 81 MG tablet Take 81 mg by mouth daily.        . benazepril-hydrochlorthiazide (LOTENSIN HCT) 10-12.5 MG per tablet Take 1 tablet by mouth Twice daily.      . calcium carbonate (OS-CAL) 600 MG TABS Take 600 mg by mouth daily.        Marland Kitchen HUMALOG 100 UNIT/ML injection Inject into the skin Continuous. Via pump      . Multiple Vitamins-Minerals (MULTIVITAMIN WITH MINERALS) tablet Take 1 tablet by mouth daily.        . pravastatin (PRAVACHOL) 20 MG tablet TAKE 1 TABLET  EVERY EVENING  30 tablet  0  . SYNTHROID 112 MCG tablet Take 1 tablet by mouth Daily.       Current Facility-Administered Medications on File Prior to Visit  Medication Dose Route Frequency Provider Last Rate Last Dose  . 0.9 %  sodium chloride infusion  500 mL Intravenous Continuous Hilarie Fredrickson, MD        Review of Systems:  As per HPI- otherwise negative.   Physical Examination: Filed Vitals:   11/27/12 0851  BP: 122/78  Pulse: 68  Temp: 98.7 F (37.1 C)  Resp: 17   Filed Vitals:   11/27/12 0851  Height: 5\' 4"  (1.626 m)  Weight: 135 lb (61.236 kg)   Body mass index is 23.16 kg/(m^2). Ideal Body Weight: Weight in (lb) to have BMI = 25: 145.3  GEN: WDWN, NAD, Non-toxic, A & O x 3, looks well HEENT: Atraumatic, Normocephalic. Neck supple. No masses, No LAD.  Bilateral TM wnl, oropharynx normal.  PEERL,EOMI.   Nasal cavity is congested Ears and Nose: No external deformity. CV: RRR, No M/G/R. No JVD. No thrill. No extra  heart sounds. PULM: CTA B, no wheezes, crackles, rhonchi. No retractions. No resp. distress. No accessory muscle use. EXTR: No c/c/e NEURO Normal gait.  PSYCH: Normally interactive. Conversant. Not depressed or anxious appearing.  Calm demeanor.   Assessment and Plan: Acute sinusitis, unspecified - Plan: amoxicillin-clavulanate (AUGMENTIN) 875-125 MG per tablet, benzonatate (TESSALON) 200 MG capsule  Cough - Plan: amoxicillin-clavulanate (AUGMENTIN) 875-125 MG per tablet, benzonatate (TESSALON) 200 MG capsule  Treat for sinusitis as above.  Close follow-up if not better, Sooner if worse.   Per her report her glucose is unchanged by this illness.   Signed Abbe Amsterdam, MD

## 2012-11-27 NOTE — Patient Instructions (Addendum)
Use the augmentin and cough perles as directed.  Please let me know if you are not better in the next few days-Sooner if worse.

## 2013-07-19 ENCOUNTER — Other Ambulatory Visit: Payer: Self-pay | Admitting: Obstetrics and Gynecology

## 2013-07-19 DIAGNOSIS — Z1231 Encounter for screening mammogram for malignant neoplasm of breast: Secondary | ICD-10-CM

## 2013-08-16 ENCOUNTER — Ambulatory Visit (HOSPITAL_COMMUNITY)
Admission: RE | Admit: 2013-08-16 | Discharge: 2013-08-16 | Disposition: A | Discharge status: Home / Self Care | Payer: 59 | Source: Ambulatory Visit | Attending: Obstetrics and Gynecology | Admitting: Obstetrics and Gynecology

## 2013-08-16 DIAGNOSIS — Z1231 Encounter for screening mammogram for malignant neoplasm of breast: Secondary | ICD-10-CM | POA: Insufficient documentation

## 2014-07-16 ENCOUNTER — Other Ambulatory Visit (HOSPITAL_COMMUNITY): Payer: Self-pay | Admitting: Obstetrics and Gynecology

## 2014-07-16 DIAGNOSIS — Z1231 Encounter for screening mammogram for malignant neoplasm of breast: Secondary | ICD-10-CM

## 2014-09-04 ENCOUNTER — Ambulatory Visit (HOSPITAL_COMMUNITY)
Admission: RE | Admit: 2014-09-04 | Discharge: 2014-09-04 | Disposition: A | Discharge status: Home / Self Care | Payer: PRIVATE HEALTH INSURANCE | Source: Ambulatory Visit | Attending: Obstetrics and Gynecology | Admitting: Obstetrics and Gynecology

## 2014-09-04 DIAGNOSIS — Z1231 Encounter for screening mammogram for malignant neoplasm of breast: Secondary | ICD-10-CM | POA: Diagnosis not present

## 2014-10-10 ENCOUNTER — Ambulatory Visit (INDEPENDENT_AMBULATORY_CARE_PROVIDER_SITE_OTHER): Payer: PRIVATE HEALTH INSURANCE | Admitting: Physician Assistant

## 2014-10-10 VITALS — BP 130/80 | HR 65 | Temp 98.4°F | Resp 16 | Ht 64.5 in | Wt 135.0 lb

## 2014-10-10 DIAGNOSIS — H6122 Impacted cerumen, left ear: Secondary | ICD-10-CM

## 2014-10-10 NOTE — Progress Notes (Signed)
  Medical screening examination/treatment/procedure(s) were performed by non-physician practitioner and as supervising physician I was immediately available for consultation/collaboration.     

## 2014-10-10 NOTE — Progress Notes (Signed)
   Subjective:    Patient ID: Leeann Must, female    DOB: Oct 04, 1960, 54 y.o.   MRN: [id number]  Chief Complaint  Patient presents with  . Cerumen Impaction    left ear   Medications, allergies, past medical history, surgical history, family history, social history and problem list reviewed and updated.  HPI  5 yof presents with left ear blocked.   Feels like impaction. Several months. Had ears flushed approx 20 yrs ago. Denies otalgia. Denies congestion or cough.   Review of Systems See HPI     Objective:   Physical Exam  Constitutional: She appears well-developed and well-nourished.  Non-toxic appearance. She does not have a sickly appearance. She does not appear ill. No distress.  BP 130/80 mmHg  Pulse 65  Temp(Src) 98.4 F (36.9 C) (Oral)  Resp 16  Ht 5' 4.5" (1.638 m)  Wt 135 lb (61.236 kg)  BMI 22.82 kg/m2  SpO2 98%   HENT:  Right Ear: Tympanic membrane normal.  Left Ear: Tympanic membrane normal.  Left ear with cerumen impaction. Flushed. TM normal post flushing.       Assessment & Plan:   Cerumen impaction, left --flushed in clinic, TM normal post flushing  Julieta Gutting, PA-C Physician Assistant-Certified [facility]  10/10/2014 7:22 PM

## 2015-02-27 ENCOUNTER — Ambulatory Visit (INDEPENDENT_AMBULATORY_CARE_PROVIDER_SITE_OTHER): Payer: PRIVATE HEALTH INSURANCE | Admitting: Emergency Medicine

## 2015-02-27 VITALS — BP 128/68 | HR 68 | Temp 98.4°F | Resp 16 | Ht 64.0 in | Wt 135.0 lb

## 2015-02-27 DIAGNOSIS — J0140 Acute pansinusitis, unspecified: Secondary | ICD-10-CM | POA: Diagnosis not present

## 2015-02-27 DIAGNOSIS — J209 Acute bronchitis, unspecified: Secondary | ICD-10-CM | POA: Diagnosis not present

## 2015-02-27 MED ORDER — AMOXICILLIN-POT CLAVULANATE 875-125 MG PO TABS: 1.0000 | ORAL_TABLET | Freq: Two times a day (BID) | ORAL | Status: DC

## 2015-02-27 MED ORDER — HYDROCOD POLST-CPM POLST ER 10-8 MG/5ML PO SUER: 5.0000 mL | Freq: Two times a day (BID) | ORAL | Status: DC

## 2015-02-27 MED ORDER — PSEUDOEPHEDRINE-GUAIFENESIN ER 60-600 MG PO TB12: 1.0000 | ORAL_TABLET | Freq: Two times a day (BID) | ORAL | Status: AC

## 2015-02-27 NOTE — Progress Notes (Signed)
Subjective:  Patient ID: Brittney [name], female    DOB: 10-Jun-1960  Age: 55 y.o. MRN: [id number]  CC: Sinus Problem; scratchy throat; and Cough   HPI Brittney [name] presents   She has nasal congestion postnasal drainage and pressure in her cheeks and for it. The drainage from her nose is purulent character she has cough productive purulent sputum. She has no wheezing or shortness of breath she has  beenexperiencing a fever but no chills. She has no nausea vomiting or stool change .Marland Kitchen She's been unable to control symptoms over-the-counter medication  History Brittney [name] has a past medical history of Diabetes mellitus; Hyperlipidemia; Hypertension; Thyroid disease; Uterine enlargement; Type 1 diabetes mellitus ([place]); Hypothyroidism; Uterine fibroid; Endocervical polyp; Increased blood pressure (not hypertension); and Oligomenorrhea.   She has past surgical history that includes Cesarean section and Myomectomy abdominal approach.   Her  family history includes Diabetes in her father, maternal grandfather, and paternal grandfather; Hypertension in her father.  She   reports that she has never smoked. She has never used smokeless tobacco. She reports that she does not drink alcohol or use illicit drugs.  Outpatient Prescriptions Prior to Visit  Medication Sig Dispense Refill  . aspirin 81 MG tablet Take 81 mg by mouth daily.      . benazepril-hydrochlorthiazide (LOTENSIN HCT) 10-12.5 MG per tablet Take 1 tablet by mouth Twice daily.    . calcium carbonate (OS-CAL) 600 MG TABS Take 600 mg by mouth daily.      Marland Kitchen HUMALOG 100 UNIT/ML injection Inject into the skin Continuous. Via pump    . Multiple Vitamins-Minerals (MULTIVITAMIN WITH MINERALS) tablet Take 1 tablet by mouth daily.      . pravastatin (PRAVACHOL) 20 MG tablet TAKE 1 TABLET EVERY EVENING 30 tablet 0  . SYNTHROID 112 MCG tablet Take 1 tablet by mouth Daily.     Facility-Administered Medications Prior to Visit  Medication Dose Route  Frequency Provider Last Rate Last Dose  . 0.9 %  sodium chloride infusion  500 mL Intravenous Continuous Brittney Shipper, MD        Social History   Social History  . Marital Status: Married    Spouse Name: N/A  . Number of Children: N/A  . Years of Education: N/A   Social History Main Topics  . Smoking status: Never Smoker   . Smokeless tobacco: Never Used  . Alcohol Use: No  . Drug Use: No  . Sexual Activity: Yes    Birth Control/ Protection: Post-menopausal   Other Topics Concern  . None   Social History Narrative     Review of Systems  Constitutional: Positive for fever and fatigue. Negative for chills and appetite change.  HENT: Positive for congestion, postnasal drip, rhinorrhea and sinus pressure. Negative for ear pain and sore throat.   Eyes: Negative for pain and redness.  Respiratory: Positive for cough. Negative for shortness of breath and wheezing.   Cardiovascular: Negative for leg swelling.  Gastrointestinal: Negative for nausea, vomiting, abdominal pain, diarrhea, constipation and blood in stool.  Endocrine: Negative for polyuria.  Genitourinary: Negative for dysuria, urgency, frequency and flank pain.  Musculoskeletal: Negative for gait problem.  Skin: Negative for rash.  Neurological: Negative for weakness and headaches.  Psychiatric/Behavioral: Negative for confusion and decreased concentration. The patient is not nervous/anxious.     Objective:  BP 128/68 mmHg  Pulse 68  Temp(Src) 98.4 F (36.9 C) (Oral)  Resp 16  Ht 5\' 4"  (1.626 m)  Wt 135 lb (61.236 kg)  BMI 23.16 kg/m2  SpO2 98%  Physical Exam  Constitutional: She is oriented to person, place, and time. She appears well-developed and well-nourished. No distress.  HENT:  Head: Normocephalic and atraumatic.  Right Ear: External ear normal.  Left Ear: External ear normal.  Nose: Nose normal.  Eyes: Conjunctivae and EOM are normal. Pupils are equal, round, and reactive to light. No scleral  icterus.  Neck: Normal range of motion. Neck supple. No tracheal deviation present.  Cardiovascular: Normal rate, regular rhythm and normal heart sounds.   Pulmonary/Chest: Effort normal. No respiratory distress. She has no wheezes. She has no rales.  Abdominal: She exhibits no mass. There is no tenderness. There is no rebound and no guarding.  Musculoskeletal: She exhibits no edema.  Lymphadenopathy:    She has no cervical adenopathy.  Neurological: She is alert and oriented to person, place, and time. Coordination normal.  Skin: Skin is warm and dry. No rash noted.  Psychiatric: She has a normal mood and affect. Her behavior is normal.      Assessment & Plan:   Brittney [name] was seen today for sinus problem, scratchy throat and cough.  Diagnoses and all orders for this visit:  Acute bronchitis, unspecified organism  Acute pansinusitis, recurrence not specified  Other orders -     amoxicillin-clavulanate (AUGMENTIN) 875-125 MG tablet; Take 1 tablet by mouth 2 (two) times daily. -     pseudoephedrine-guaifenesin (MUCINEX D) 60-600 MG 12 hr tablet; Take 1 tablet by mouth every 12 (twelve) hours. -     chlorpheniramine-HYDROcodone (TUSSIONEX PENNKINETIC ER) 10-8 MG/5ML SUER; Take 5 mLs by mouth 2 (two) times daily.  I am having Brittney [name] start on amoxicillin-clavulanate, pseudoephedrine-guaifenesin, and chlorpheniramine-HYDROcodone. I am also having her maintain her benazepril-hydrochlorthiazide, HUMALOG, SYNTHROID, aspirin, multivitamin with minerals, calcium carbonate, and pravastatin. We will continue to administer sodium chloride.  Meds ordered this encounter  Medications  . amoxicillin-clavulanate (AUGMENTIN) 875-125 MG tablet    Sig: Take 1 tablet by mouth 2 (two) times daily.    Dispense:  20 tablet    Refill:  0  . pseudoephedrine-guaifenesin (MUCINEX D) 60-600 MG 12 hr tablet    Sig: Take 1 tablet by mouth every 12 (twelve) hours.    Dispense:  18 tablet    Refill:  0  .  chlorpheniramine-HYDROcodone (TUSSIONEX PENNKINETIC ER) 10-8 MG/5ML SUER    Sig: Take 5 mLs by mouth 2 (two) times daily.    Dispense:  60 mL    Refill:  0    Appropriate red flag conditions were discussed with the patient as well as actions that should be taken.  Patient expressed his understanding.  Follow-up: Return if symptoms worsen or fail to improve.  Roselee Culver, MD

## 2015-02-27 NOTE — Patient Instructions (Signed)

## 2015-08-11 ENCOUNTER — Encounter: Payer: Self-pay | Admitting: Internal Medicine

## 2016-06-17 ENCOUNTER — Encounter (HOSPITAL_COMMUNITY): Payer: Self-pay | Admitting: Emergency Medicine

## 2016-06-17 ENCOUNTER — Emergency Department (HOSPITAL_COMMUNITY): Payer: Managed Care, Other (non HMO)

## 2016-06-17 ENCOUNTER — Emergency Department (HOSPITAL_COMMUNITY)
Admission: EM | Admit: 2016-06-17 | Discharge: 2016-06-17 | Disposition: A | Discharge status: Home / Self Care | Payer: Managed Care, Other (non HMO) | Attending: Emergency Medicine | Admitting: Emergency Medicine

## 2016-06-17 DIAGNOSIS — E109 Type 1 diabetes mellitus without complications: Secondary | ICD-10-CM | POA: Diagnosis not present

## 2016-06-17 DIAGNOSIS — I447 Left bundle-branch block, unspecified: Secondary | ICD-10-CM | POA: Diagnosis not present

## 2016-06-17 DIAGNOSIS — E039 Hypothyroidism, unspecified: Secondary | ICD-10-CM | POA: Insufficient documentation

## 2016-06-17 DIAGNOSIS — I10 Essential (primary) hypertension: Secondary | ICD-10-CM | POA: Insufficient documentation

## 2016-06-17 DIAGNOSIS — Z7982 Long term (current) use of aspirin: Secondary | ICD-10-CM | POA: Diagnosis not present

## 2016-06-17 DIAGNOSIS — R55 Syncope and collapse: Secondary | ICD-10-CM | POA: Diagnosis present

## 2016-06-17 LAB — CBC WITH DIFFERENTIAL/PLATELET
BASOS PCT: 0 %
Basophils Absolute: 0 10*3/uL (ref 0.0–0.1)
Eosinophils Absolute: 0 10*3/uL (ref 0.0–0.7)
Eosinophils Relative: 0 %
HEMATOCRIT: 47.1 % — AB (ref 36.0–46.0)
Hemoglobin: 16.2 g/dL — ABNORMAL HIGH (ref 12.0–15.0)
LYMPHS PCT: 12 %
Lymphs Abs: 2.1 10*3/uL (ref 0.7–4.0)
MCH: 30.6 pg (ref 26.0–34.0)
MCHC: 34.4 g/dL (ref 30.0–36.0)
MCV: 88.9 fL (ref 78.0–100.0)
MONOS PCT: 4 %
Monocytes Absolute: 0.8 10*3/uL (ref 0.1–1.0)
NEUTROS ABS: 14.9 10*3/uL — AB (ref 1.7–7.7)
Neutrophils Relative %: 84 %
Platelets: 217 10*3/uL (ref 150–400)
RBC: 5.3 MIL/uL — ABNORMAL HIGH (ref 3.87–5.11)
RDW: 13.1 % (ref 11.5–15.5)
WBC: 17.8 10*3/uL — ABNORMAL HIGH (ref 4.0–10.5)

## 2016-06-17 LAB — CBG MONITORING, ED: Glucose-Capillary: 178 mg/dL — ABNORMAL HIGH (ref 65–99)

## 2016-06-17 LAB — BASIC METABOLIC PANEL
Anion gap: 17 — ABNORMAL HIGH (ref 5–15)
BUN: 19 mg/dL (ref 6–20)
CALCIUM: 9.8 mg/dL (ref 8.9–10.3)
CO2: 23 mmol/L (ref 22–32)
CREATININE: 1.24 mg/dL — AB (ref 0.44–1.00)
Chloride: 99 mmol/L — ABNORMAL LOW (ref 101–111)
GFR calc Af Amer: 55 mL/min — ABNORMAL LOW (ref >60)
GFR calc non Af Amer: 48 mL/min — ABNORMAL LOW (ref >60)
Glucose, Bld: 148 mg/dL — ABNORMAL HIGH (ref 65–99)
Potassium: 3.5 mmol/L (ref 3.5–5.1)
Sodium: 139 mmol/L (ref 135–145)

## 2016-06-17 LAB — I-STAT TROPONIN, ED: Troponin i, poc: 0 ng/mL (ref 0.00–0.08)

## 2016-06-17 NOTE — ED Provider Notes (Signed)
[facility] DEPT Provider Note   CSN: [id number] Arrival date & time: 06/17/16  1402     History   Chief Complaint Chief Complaint  Patient presents with  . Near Syncope    HPI Brittney [name] is a 56 y.o. female.  HPI   Patient is a 56 year old female with history of type 1 diabetes, hypertension, hyperlipidemia and hypothyroidism who presents to the ED via EMS with complaint of near syncopal episode. Patient reports she was sitting at her desk at work, notes multiple co-workers walked into her office and reports beginning to feel slightly lightheaded. She states that she stood up to walk outside to catch a breath of fresh air she began to have significantly worsening lightheadedness. 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 Patient reports her coworkers deny loss of consciousness, fall or head injury. She states her glucose level was checked prior to arrival of EMS, CBG 90. Patient reports having an insulin pump and notes she ate breakfast this morning but denies having lunch. Patient denies any recent changes regarding her insulin pump or medications. She reports having a similar episode multiple years ago where she was evaluated in the hospital and admitted with a negative workup and states her sig one episode was attributed to a vasovagal episode. Patient denies having any symptoms at EMS arrival. Denies fever, chills, headache, visual changes, cough, shortness of breath, chest pain, palpitations, abdominal pain, nausea, vomiting, numbness, weakness.   Past Medical History:  Diagnosis Date  . Diabetes mellitus    has insulin pump  . Endocervical polyp   . Hyperlipidemia   . Hypertension   . Hypothyroidism   . Increased blood pressure (not hypertension)   . Oligomenorrhea   . Thyroid disease   . Type 1 diabetes mellitus ([place])   . Uterine enlargement   . Uterine fibroid     Patient Active Problem List     Diagnosis Date Noted  . Hyperlipidemia   . Hypertension   . Thyroid disease   . Uterine enlargement   . Type 1 diabetes mellitus ([place])   . Hypothyroidism   . Uterine fibroid   . Endocervical polyp   . Increased blood pressure (not hypertension)   . Oligomenorrhea   . ABNORMAL STRESS ELECTROCARDIOGRAM 02/11/2010  . MITRAL VALVE DISORDERS 02/02/2010  . SYNCOPE 02/02/2010    Past Surgical History:  Procedure Laterality Date  . CESAREAN SECTION    . MYOMECTOMY ABDOMINAL APPROACH      OB History    Gravida Para Term Preterm AB Living   1 1       1    SAB TAB Ectopic Multiple Live Births           1       Home Medications    Prior to Admission medications   Medication Sig Start Date End Date Taking? Authorizing Provider  amoxicillin-clavulanate (AUGMENTIN) 875-125 MG tablet Take 1 tablet by mouth 2 (two) times daily. 02/27/15   Roselee Culver, MD  aspirin 81 MG tablet Take 81 mg by mouth daily.      Historical Provider, MD  benazepril-hydrochlorthiazide (LOTENSIN HCT) 10-12.5 MG per tablet Take 1 tablet by mouth Twice daily. 06/29/10   Historical Provider, MD  calcium carbonate (OS-CAL) 600 MG TABS Take 600 mg by mouth daily.      Historical Provider, MD  chlorpheniramine-HYDROcodone (TUSSIONEX PENNKINETIC ER) 10-8 MG/5ML SUER Take 5 mLs  by mouth 2 (two) times daily. 02/27/15   Roselee Culver, MD  HUMALOG 100 UNIT/ML injection Inject into the skin Continuous. Via pump 07/22/10   Historical Provider, MD  Multiple Vitamins-Minerals (MULTIVITAMIN WITH MINERALS) tablet Take 1 tablet by mouth daily.      Historical Provider, MD  pravastatin (PRAVACHOL) 20 MG tablet TAKE 1 TABLET EVERY EVENING 04/11/12   Larey Dresser, MD  SYNTHROID 112 MCG tablet Take 1 tablet by mouth Daily. 06/29/10   Historical Provider, MD    Family History Family History  Problem Relation Age of Onset  . Diabetes Father   . Hypertension Father   . Diabetes Paternal Grandfather   . Diabetes Maternal  Grandfather     Social History Social History  Substance Use Topics  . Smoking status: Never Smoker  . Smokeless tobacco: Never Used  . Alcohol use No     Allergies   Patient has no known allergies.   Review of Systems Review of Systems  Neurological: Positive for syncope (near) and light-headedness.  All other systems reviewed and are negative.    Physical Exam Updated Vital Signs BP 131/61   Pulse 60   Temp 97.7 F (36.5 C) (Oral)   Resp 16   Ht 5\' 4"  (1.626 m)   Wt 61.2 kg   SpO2 100%   BMI 23.17 kg/m   Physical Exam  Constitutional: She is oriented to person, place, and time. She appears well-developed and well-nourished.  HENT:  Head: Normocephalic and atraumatic. Head is without raccoon's eyes, without Battle's sign, without abrasion and without laceration.  Right Ear: Tympanic membrane normal. No hemotympanum.  Left Ear: Tympanic membrane normal. No hemotympanum.  Nose: Nose normal. No sinus tenderness, nasal deformity, septal deviation or nasal septal hematoma. No epistaxis.  Mouth/Throat: Uvula is midline, oropharynx is clear and moist and mucous membranes are normal. No oropharyngeal exudate, posterior oropharyngeal edema, posterior oropharyngeal erythema or tonsillar abscesses.  Eyes: Conjunctivae and EOM are normal. Pupils are equal, round, and reactive to light. Right eye exhibits no discharge. Left eye exhibits no discharge. No scleral icterus.  Neck: Normal range of motion. Neck supple.  Cardiovascular: Normal rate, regular rhythm, normal heart sounds and intact distal pulses.   Pulmonary/Chest: Effort normal and breath sounds normal.  Abdominal: Soft. Bowel sounds are normal. She exhibits no distension. There is no tenderness.  Musculoskeletal: Normal range of motion. She exhibits no edema or tenderness.  No cervical, thoracic, or lumbar spine midline TTP. Full ROM of bilateral upper and lower extremities with 5/5 strength.   2+ radial and PT  pulses. Sensation grossly intact.  Pt able to stand and ambulate without assistance, no ataxia noted.  Neurological: She is alert and oriented to person, place, and time. She has normal strength. No cranial nerve deficit or sensory deficit. Coordination and gait normal.  Skin: Skin is warm and dry.  Nursing note and vitals reviewed.    ED Treatments / Results  Labs (all labs ordered are listed, but only abnormal results are displayed) Labs Reviewed  CBC WITH DIFFERENTIAL/PLATELET - Abnormal; Notable for the following:       Result Value   WBC 17.8 (*)    RBC 5.30 (*)    Hemoglobin 16.2 (*)    HCT 47.1 (*)    Neutro Abs 14.9 (*)    All other components within normal limits  BASIC METABOLIC PANEL - Abnormal; Notable for the following:    Chloride 99 (*)    Glucose,  Bld 148 (*)    Creatinine, Ser 1.24 (*)    GFR calc non Af Amer 48 (*)    GFR calc Af Amer 55 (*)    Anion gap 17 (*)    All other components within normal limits  CBG MONITORING, ED - Abnormal; Notable for the following:    Glucose-Capillary 178 (*)    All other components within normal limits  I-STAT TROPOININ, ED    EKG  EKG Interpretation  Date/Time:  Thursday June 17 2016 14:04:28 EDT Ventricular Rate:  60 PR Interval:    QRS Duration: 132 QT Interval:  500 QTC Calculation: 500 R Axis:   87 Text Interpretation:  Sinus rhythm LVH with secondary repolarization abnormality Anterior infarct, acute (LAD) new LBBB since 2011 Confirmed by Alvino Chapel  MD, NATHAN [phone]) on 06/17/2016 2:31:02 PM       Radiology Dg Chest 2 View  Result Date: 06/17/2016 CLINICAL DATA:  Near syncope EXAM: CHEST  2 VIEW COMPARISON:  None. FINDINGS: Lungs are hyperexpanded. The lungs are clear wiithout focal pneumonia, edema, pneumothorax or pleural effusion. The cardiopericardial silhouette is within normal limits for size. The visualized bony structures of the thorax are intact. Telemetry leads overlie the chest. IMPRESSION: No  active cardiopulmonary disease. Electronically Signed   By: Misty Stanley M.D.   On: 06/17/2016 14:53    Procedures Procedures (including critical care time)  Medications Ordered in ED Medications - No data to display   Initial Impression / Assessment and Plan / ED Course  I have reviewed the triage vital signs and the nursing notes.  Pertinent labs & imaging results that were available during my care of the patient were reviewed by me and considered in my medical decision making (see chart for details).    Patient presents after near syncopal episode. Reports feeling dizzy while she was sitting in her chair at work which worsened when she stood up and walked to go outside. Coworkers deny loss of consciousness, fall or head injury. PMH type 1 diabetes, hypertension, hyperlipidemia and hypothyroidism. VSS. Exam unremarkable. No neuro deficits. Patient able to stand and ambulate without assistance. Patient denies any pain or complaints at this time. Negative orthostatics. CBG 178. Labs showed WBC 17.8, Hgb 16.2, Cr 1.24 and AG 17 which I suspect is due to mild dehydration. CXR negative. Trop negative. EKG showed sinus rhythm with new LBBB (compared to most recent EKG in 2011). On reevaluation, pt continues to deny any sxs while in the ED. Denies having any CP, SOB or palpitations now or during episode. Consulted cardiology regarding new LBBB. Dr. Debara Pickett advised to have pt follow up with cardiology outpatient to have a stress test performed for further evaluation of LBBB. Pt has remained hemodynamically stable while in the ED. Discussed results and plan for d/c with pt. Advised pt to f/u with cardiology for further evaluation of LBBB. Discussed strict return precautions.   Final Clinical Impressions(s) / ED Diagnoses   Final diagnoses:  Near syncope  LBBB (left bundle branch block)    New Prescriptions New Prescriptions   No medications on file     Nona Dell, PA-C 06/17/16  [place], MD 06/17/16 1600

## 2016-06-17 NOTE — ED Notes (Signed)
Pt CBG was 178, notified Crystal(RN)

## 2016-06-17 NOTE — ED Triage Notes (Signed)
Patient from work with [facility] for near syncope.  Patient states she suddenly felt weak and has minutes after this that she is unable to recall.  Blood pressure 100/75 on EMS arrival on scene, pressure 162/73 at this time.  Patient alert an oriented and in no apparent distress at this time.

## 2016-06-17 NOTE — Discharge Instructions (Signed)
Continue taking your home medications as prescribed. I also recommend continue drinking fluids at home to remain hydrated. Call the cardiology clinic listed below this afternoon to schedule a follow-up appointment for further outpatient evaluation including having a stress test performed for further evaluation of your new left bundle branch block. Please return to the Emergency Department if symptoms worsen or new onset of fever, headache, lightheadedness, dizziness, cough, shortness of breath, chest pain, heart palpitations, abdominal pain, nausea, vomiting numbness, weakness, syncope, seizure.

## 2016-06-21 ENCOUNTER — Ambulatory Visit (INDEPENDENT_AMBULATORY_CARE_PROVIDER_SITE_OTHER): Payer: Managed Care, Other (non HMO) | Admitting: Cardiovascular Disease

## 2016-06-21 ENCOUNTER — Encounter: Payer: Self-pay | Admitting: Cardiovascular Disease

## 2016-06-21 VITALS — BP 144/76 | HR 76 | Ht 64.5 in | Wt 134.0 lb

## 2016-06-21 DIAGNOSIS — I447 Left bundle-branch block, unspecified: Secondary | ICD-10-CM | POA: Diagnosis not present

## 2016-06-21 DIAGNOSIS — E109 Type 1 diabetes mellitus without complications: Secondary | ICD-10-CM

## 2016-06-21 DIAGNOSIS — E039 Hypothyroidism, unspecified: Secondary | ICD-10-CM | POA: Diagnosis not present

## 2016-06-21 DIAGNOSIS — I10 Essential (primary) hypertension: Secondary | ICD-10-CM

## 2016-06-21 DIAGNOSIS — E784 Other hyperlipidemia: Secondary | ICD-10-CM

## 2016-06-21 DIAGNOSIS — R42 Dizziness and giddiness: Secondary | ICD-10-CM

## 2016-06-21 DIAGNOSIS — R55 Syncope and collapse: Secondary | ICD-10-CM | POA: Diagnosis not present

## 2016-06-21 DIAGNOSIS — E7849 Other hyperlipidemia: Secondary | ICD-10-CM

## 2016-06-21 MED ORDER — AMLODIPINE BESYLATE 2.5 MG PO TABS: 2.5000 mg | ORAL_TABLET | Freq: Every day | ORAL | 6 refills | Status: DC

## 2016-06-21 NOTE — Progress Notes (Signed)
Cardiology Office Note    Date:  06/28/2016   ID:  Brittney [name], DOB Jun 28, 1960, MRN [id number]  PCP:  Dr. Kendall [name] Cardiologist:  Brittney Majestic, MD   Chief Complaint  Patient presents with  . New Patient (Initial Visit)    NO chest pain, shortness of breath, edema, pain or cramping in legs, lightheaded or dizziness; ED follow up    History of Present Illness:  Brittney [name] is a 56 y.o. female who is referred by Dr. Davonna [name] following emergency room evaluation for presyncope where she was found to have left bundle branch block.  Brittney [name] has a history of type 1 diabetes mellitus since age 1.  She is followed very closely by Dr. Joneen [name] in endocrinology at [facility].  In 1999, she was started on an insulin pump. She has a history of hypertension and was started on medication approximately 16 years ago.  She has been taking, benazapril/HCT 10/12.5 twice a day.  She has a history of hyperlipidemia and 7 years ago was started on pravastatin 20 mg daily.  She has a history of hypothyroidism for which she has been taking Synthroid 112 g daily.  She denies any awareness of chest pain, PND, orthopnea.  She is unaware of palpitations.  On 06/17/2016 she developed a near syncopal episode while at work.  Apparently, she had just walked through a patch of bugs and then started to itch all over.  She suddenly became lightheaded, but did not lose consciousness or fall.  Due to persistent symptoms, EMS was called.  Her CBG was 90.  She has an insulin pump and had eaten breakfast, but had not had lunch.  She had a similar episode in 2011 which was attributed to a vasovagal episode.  In the ER her blood pressure was 131/61.  Her pulse was 60.  She was not anemic, but white count was 17.8.  A be met was normal with a creatinine of 1.24, glucose 148.  Her ECG showed sinus rhythm with a new left bundle branch block from her 2011 ECG.  In the emergency room, she had a negative workup.  A chest x-ray  was negative.  Troponin was negative.  Because of her new left bundle branch block, she is now referred for cardiology consultation and evaluation.  Upon arrival to the emergency room, she denied fevers, chills, headache, visual changes, cough, shortness of breath, chest pain, palpitations, abdominal pain, nausea, or vomiting.  She denied any paresthesias.  While in the emergency room, her blood pressure was 131/61.  Her pulse was 60.  She had negative orthostatics.  Her CBG was 178.  Laboratory revealed an elevated white blood count at 17.8 with a normal hemoglobin and hematocrit.  Cr was 1.24 with an anion gap of 17, which was felt possibly due to mild dehydration.  Her chest x-ray was negative.  Troponin was negative.  Her ECG showed sinus rhythm but there was a new left bundle branch block.  Compared to her prior ECG of 2011.  Because of this new left bundle branch block.  She is now referred for cardiology consultation and evaluation.   Past Medical History:  Diagnosis Date  . Diabetes mellitus    has insulin pump  . Endocervical polyp   . Hyperlipidemia   . Hypertension   . Hypothyroidism   . Increased blood pressure (not hypertension)   . Oligomenorrhea   . Thyroid disease   . Type 1 diabetes mellitus (Brittney [name])   .  Uterine enlargement   . Uterine fibroid     Past Surgical History:  Procedure Laterality Date  . CESAREAN SECTION    . MYOMECTOMY ABDOMINAL APPROACH      Current Medications: Outpatient Medications Prior to Visit  Medication Sig Dispense Refill  . aspirin 81 MG tablet Take 81 mg by mouth daily.      . benazepril-hydrochlorthiazide (LOTENSIN HCT) 10-12.5 MG per tablet Take 1 tablet by mouth Twice daily.    . calcium carbonate (OS-CAL) 600 MG TABS Take 600 mg by mouth daily.      Marland Kitchen HUMALOG 100 UNIT/ML injection Inject into the skin Continuous. Via pump    . Multiple Vitamins-Minerals (MULTIVITAMIN WITH MINERALS) tablet Take 1 tablet by mouth daily.      . pravastatin  (PRAVACHOL) 20 MG tablet TAKE 1 TABLET EVERY EVENING 30 tablet 0  . SYNTHROID 112 MCG tablet Take 1 tablet by mouth Daily.    Marland Kitchen amoxicillin-clavulanate (AUGMENTIN) 875-125 MG tablet Take 1 tablet by mouth 2 (two) times daily. (Patient not taking: Reported on 06/21/2016) 20 tablet 0  . chlorpheniramine-HYDROcodone (TUSSIONEX PENNKINETIC ER) 10-8 MG/5ML SUER Take 5 mLs by mouth 2 (two) times daily. (Patient not taking: Reported on 06/21/2016) 60 mL 0  . 0.9 %  sodium chloride infusion      No facility-administered medications prior to visit.      Allergies:   Patient has no known allergies.   Social History   Social History  . Marital status: Married    Spouse name: N/A  . Number of children: N/A  . Years of education: N/A   Social History Main Topics  . Smoking status: Never Smoker  . Smokeless tobacco: Never Used  . Alcohol use No  . Drug use: No  . Sexual activity: Yes    Birth control/ protection: Post-menopausal   Other Topics Concern  . None   Social History Narrative  . None    Socially, she works in the [employer] at [employer] day [employer].  She has a Scientist, water quality.  There is no tobacco history.  She very rarely has a glass of wine.  She does exercise daily, doing elliptical, running or walking 7 days per week for at least 30 minutes a session.  She also uses weight machine 2 days per week.  Family History:  The patient's family history includes Diabetes in her father, maternal grandfather, paternal grandfather, and sister; Hypertension in her father and sister; Hypothyroidism in her sister.  .  She has a twin sister who also has type 1 diabetes mellitus, hypertension and hypothyroidism.  Her father died at age 67 and had diabetes, hypertension, and suffered a heart attack at age 52.  She has a daughter age 23.  ROS General: Negative; No fevers, chills, or night sweats;  HEENT: Negative; No changes in vision or hearing, sinus congestion, difficulty  swallowing Pulmonary: Negative; No cough, wheezing, shortness of breath, hemoptysis Cardiovascular: Negative; No chest pain, presyncope, syncope, palpitations GI: Negative; No nausea, vomiting, diarrhea, or abdominal pain GU: Negative; No dysuria, hematuria, or difficulty voiding Musculoskeletal: Negative; no myalgias, joint pain, or weakness Hematologic/Oncology: Negative; no easy bruising, bleeding Endocrine: Negative; no heat/cold intolerance; no diabetes Neuro: Negative; no changes in balance, headaches Skin: Negative; No rashes or skin lesions Psychiatric: Negative; No behavioral problems, depression Sleep: Negative; No snoring, daytime sleepiness, hypersomnolence, bruxism, restless legs, hypnogognic hallucinations, no cataplexy Other comprehensive 14 point system review is negative.   PHYSICAL EXAM:   VS:  BP (!) 144/76 (BP Location: Left Arm, Patient Position: Sitting, Cuff Size: Normal)   Pulse 76   Ht 5' 4.5" (1.638 m)   Wt 134 lb (60.8 kg)   BMI 22.65 kg/m     Repeat blood pressure by me was 160/74 supine and was 158/80 standing  Wt Readings from Last 3 Encounters:  06/24/16 134 lb (60.8 kg)  06/21/16 134 lb (60.8 kg)  06/17/16 135 lb (61.2 kg)    General: Alert, oriented, no distress.  Skin: normal turgor, no rashes, warm and dry HEENT: Normocephalic, atraumatic. Pupils equal round and reactive to light; sclera anicteric; extraocular muscles intact; Fundi without obvious retinopathy.  Disc flat Nose without nasal septal hypertrophy Mouth/Parynx benign; Mallinpatti scale 2 Neck: No JVD, no carotid bruits; normal carotid upstroke Lungs: clear to ausculatation and percussion; no wheezing or rales Chest wall: without tenderness to palpitation Heart: PMI not displaced, RRR, s1 s2 normal, 2/6 systolic murmur at the left sternal border and apex, no diastolic murmur, no rubs, gallops, thrills, or heaves Abdomen: soft, nontender; no hepatosplenomehaly, BS+; abdominal aorta  nontender and not dilated by palpation.  Insulin pump. Back: no CVA tenderness Pulses 2+ Musculoskeletal: full range of motion, normal strength, no joint deformities Extremities: no clubbing cyanosis or edema, Homan's sign negative  Neurologic: grossly nonfocal; Cranial nerves grossly wnl Psychologic: Normal mood and affect   Studies/Labs Reviewed:   EKG:  EKG is ordered today.  ECG (independently read by me):  Normal sinus rhythm at 77 bpm.  Left bundle branch block with repolarization changes.  PR interval 148 ms; QTc interval 454 ms.  Recent Labs: BMP Latest Ref Rng & Units 06/21/2016 06/17/2016 11/14/2009  Glucose 65 - 99 mg/dL 196(H) 148(H) 231(H)  BUN 7 - 25 mg/dL '19 19 14  ' Creatinine 0.50 - 1.05 mg/dL 1.23(H) 1.24(H) 1.08  Sodium 135 - 146 mmol/L 139 139 137  Potassium 3.5 - 5.3 mmol/L 4.5 3.5 3.9  Chloride 98 - 110 mmol/L 100 99(L) 105  CO2 20 - 31 mmol/L '27 23 24  ' Calcium 8.6 - 10.4 mg/dL 9.8 9.8 8.5     Hepatic Function Latest Ref Rng & Units 06/21/2016  Total Protein 6.1 - 8.1 g/dL 7.0  Albumin 3.6 - 5.1 g/dL 4.2  AST 10 - 35 U/L 25  ALT 6 - 29 U/L 28  Alk Phosphatase 33 - 130 U/L 64  Total Bilirubin 0.2 - 1.2 mg/dL 0.5    CBC Latest Ref Rng & Units 06/17/2016 11/14/2009 11/13/2009  WBC 4.0 - 10.5 K/uL 17.8(H) 8.4 16.6(H)  Hemoglobin 12.0 - 15.0 g/dL 16.2(H) 12.9 15.3(H)  Hematocrit 36.0 - 46.0 % 47.1(H) 37.4 44.3  Platelets 150 - 400 K/uL 217 177 186   Lab Results  Component Value Date   MCV 88.9 06/17/2016   MCV 88.0 11/14/2009   MCV 90.0 11/13/2009   Lab Results  Component Value Date   TSH 4.56 (H) 06/21/2016   No results found for: HGBA1C   BNP No results found for: BNP  ProBNP No results found for: PROBNP   Lipid Panel     Component Value Date/Time   CHOL 181 06/21/2016 0815   TRIG 58 06/21/2016 0815   HDL 83 06/21/2016 0815   CHOLHDL 2.2 06/21/2016 0815   VLDL 12 06/21/2016 0815   LDLCALC 86 06/21/2016 0815   LDLDIRECT 115.1 02/03/2010  0835     RADIOLOGY: Dg Chest 2 View  Result Date: 06/17/2016 CLINICAL DATA:  Near syncope EXAM: CHEST  2  VIEW COMPARISON:  None. FINDINGS: Lungs are hyperexpanded. The lungs are clear wiithout focal pneumonia, edema, pneumothorax or pleural effusion. The cardiopericardial silhouette is within normal limits for size. The visualized bony structures of the thorax are intact. Telemetry leads overlie the chest. IMPRESSION: No active cardiopulmonary disease. Electronically Signed   By: Misty Stanley M.D.   On: 06/17/2016 14:53     Additional studies/ records that were reviewed today include:  I reviewed the patient's ER evaluation in detail as well as laboratory.    ASSESSMENT:    1. Postural dizziness with presyncope   2. Essential hypertension   3. Acquired hypothyroidism   4. Other hyperlipidemia   5. LBBB (left bundle branch block)   6. Type 1 diabetes mellitus without complication [facility])      PLAN:  Brittney [name] is a 56 year old female who has a history of type 1 diabetes mellitus since age 16 and has been on insulin pump since 1999.  She has a history of hypertension for 16 years and has been on HCT since that time, has a history of hyperlipidemia, currently on pravastatin, as well as a history of hypothyroidism on Synthroid replacement.  She recently developed an episode of lightheadedness which seemed to occur after she had inadvertently walked through a nest of bugs.  She noticed itching and pruritus in the possibility of transient associated vasodilation in etiology of her event.  At the time of her evaluation, she was not hypoglycemic and she did not have orthostatic height.  Hypotension.  She was found to have a new left bundle branch block which is new compared to her 2011 ECG.  Her blood pressure today is elevated and she is not orthostatic by my exam.  I discussed with her the new hypertensive guidelines.  With her diabetes mellitus, I have recommended initiation of low-dose  amlodipine at 2.5 mg daily for more optimal blood pressure control.  She has a 2/6 systolic murmur at the lower sternal border and apex and I'm scheduling her for an echo Doppler study for evaluation.  With her long-standing diabetes mellitus of 48 years, as well as her family history of CAD with her father suffering a myocardial infarction at age 47, I am recommending an initial ischemic evaluation with a Dickenson study for further assessment.  I'm rechecking her chemistry profile, I will also check thyroid studies and follow-up lipid panel. Target LDL is less than 70 in this patient with diabetes.  She will continue to monitor her blood pressure at home following initiation of low-dose amlodipine. I will see her in the office in follow-up of the above studies and further recommendations will be made at that time.   Medication Adjustments/Labs and Tests Ordered: Current medicines are reviewed at length with the patient today.  Concerns regarding medicines are outlined above.  Medication changes, Labs and Tests ordered today are listed in the Patient Instructions below.  Patient Instructions  Your physician has requested that you have an echocardiogram. Echocardiography is a painless test that uses sound waves to create images of your heart. It provides your doctor with information about the size and shape of your heart and how well your heart's chambers and valves are working. This procedure takes approximately one hour. There are no restrictions for this procedure.  Your physician has requested that you have a lexiscan myoview. For further information please visit [facility]iest[email or web address]. Please follow instruction sheet, as given.  Your physician recommends that you return for lab work fasting.  Your physician has recommended you make the following change in your medication:   1.) start new prescription given today for amlodipine 2.5 mg as directed on the bottle. This has been sent to your  pharmacy.  Your physician recommends that you schedule a follow-up appointment in: 3-4 weeks with Dr Claiborne Billings.       Signed, Brittney Majestic, MD  06/28/2016 9:24 AM    [facility] [address] [place], [place], [place]  [address] Phone: [phone]

## 2016-06-21 NOTE — Patient Instructions (Signed)
Your physician has requested that you have an echocardiogram. Echocardiography is a painless test that uses sound waves to create images of your heart. It provides your doctor with information about the size and shape of your heart and how well your heart's chambers and valves are working. This procedure takes approximately one hour. There are no restrictions for this procedure.  Your physician has requested that you have a lexiscan myoview. For further information please visit [email or web address]. Please follow instruction sheet, as given.  Your physician recommends that you return for lab work fasting.  Your physician has recommended you make the following change in your medication:   1.) start new prescription given today for amlodipine 2.5 mg as directed on the bottle. This has been sent to your pharmacy.  Your physician recommends that you schedule a follow-up appointment in: 3-4 weeks with Dr Claiborne Billings.

## 2016-06-22 ENCOUNTER — Telehealth (HOSPITAL_COMMUNITY): Payer: Self-pay

## 2016-06-22 LAB — COMPREHENSIVE METABOLIC PANEL
ALT: 28 U/L (ref 6–29)
AST: 25 U/L (ref 10–35)
Albumin: 4.2 g/dL (ref 3.6–5.1)
Alkaline Phosphatase: 64 U/L (ref 33–130)
BUN: 19 mg/dL (ref 7–25)
CO2: 27 mmol/L (ref 20–31)
Calcium: 9.8 mg/dL (ref 8.6–10.4)
Chloride: 100 mmol/L (ref 98–110)
Creat: 1.23 mg/dL — ABNORMAL HIGH (ref 0.50–1.05)
Glucose, Bld: 196 mg/dL — ABNORMAL HIGH (ref 65–99)
Potassium: 4.5 mmol/L (ref 3.5–5.3)
Sodium: 139 mmol/L (ref 135–146)
Total Bilirubin: 0.5 mg/dL (ref 0.2–1.2)
Total Protein: 7 g/dL (ref 6.1–8.1)

## 2016-06-22 LAB — LIPID PANEL
Cholesterol: 181 mg/dL (ref <200)
HDL: 83 mg/dL (ref >50)
LDL Cholesterol: 86 mg/dL (ref <100)
TRIGLYCERIDES: 58 mg/dL (ref <150)
Total CHOL/HDL Ratio: 2.2 Ratio (ref <5.0)
VLDL: 12 mg/dL (ref <30)

## 2016-06-22 LAB — TSH: TSH: 4.56 mIU/L — ABNORMAL HIGH

## 2016-06-22 NOTE — Telephone Encounter (Signed)
Encounter complete. 

## 2016-06-23 ENCOUNTER — Telehealth: Payer: Self-pay | Admitting: Cardiovascular Disease

## 2016-06-23 ENCOUNTER — Telehealth (HOSPITAL_COMMUNITY): Payer: Self-pay

## 2016-06-23 NOTE — Telephone Encounter (Signed)
Encounter complete. 

## 2016-06-23 NOTE — Telephone Encounter (Signed)
Called patient regarding 4 week followup with Dr. Claiborne Billings.  Dr. Claiborne Billings has openings on 07-21-16.

## 2016-06-24 ENCOUNTER — Ambulatory Visit (HOSPITAL_COMMUNITY)
Admission: RE | Admit: 2016-06-24 | Discharge: 2016-06-24 | Disposition: A | Discharge status: Home / Self Care | Payer: Managed Care, Other (non HMO) | Source: Ambulatory Visit | Attending: Cardiology | Admitting: Cardiology

## 2016-06-24 ENCOUNTER — Ambulatory Visit: Payer: PRIVATE HEALTH INSURANCE | Admitting: Cardiovascular Disease

## 2016-06-24 DIAGNOSIS — Z8249 Family history of ischemic heart disease and other diseases of the circulatory system: Secondary | ICD-10-CM | POA: Insufficient documentation

## 2016-06-24 DIAGNOSIS — I10 Essential (primary) hypertension: Secondary | ICD-10-CM | POA: Insufficient documentation

## 2016-06-24 DIAGNOSIS — E079 Disorder of thyroid, unspecified: Secondary | ICD-10-CM | POA: Diagnosis not present

## 2016-06-24 DIAGNOSIS — E119 Type 2 diabetes mellitus without complications: Secondary | ICD-10-CM | POA: Diagnosis not present

## 2016-06-24 DIAGNOSIS — I447 Left bundle-branch block, unspecified: Secondary | ICD-10-CM | POA: Insufficient documentation

## 2016-06-24 LAB — MYOCARDIAL PERFUSION IMAGING
CHL CUP NUCLEAR SDS: 3
LV dias vol: 86 mL (ref 46–106)
LV sys vol: 36 mL
NUC STRESS TID: 1.02
Peak HR: 102 {beats}/min
Rest HR: 64 {beats}/min
SRS: 0
SSS: 3

## 2016-06-24 MED ORDER — TECHNETIUM TC 99M TETROFOSMIN IV KIT
31.0000 | PACK | Freq: Once | INTRAVENOUS | Status: AC | PRN
  Administered 2016-06-24: 31 via INTRAVENOUS
  Filled 2016-06-24: qty 31

## 2016-06-24 MED ORDER — TECHNETIUM TC 99M TETROFOSMIN IV KIT
10.5000 | PACK | Freq: Once | INTRAVENOUS | Status: AC | PRN
  Administered 2016-06-24: 10.5 via INTRAVENOUS
  Filled 2016-06-24: qty 11

## 2016-06-24 MED ORDER — REGADENOSON 0.4 MG/5ML IV SOLN
0.4000 mg | Freq: Once | INTRAVENOUS | Status: AC
  Administered 2016-06-24: 0.4 mg via INTRAVENOUS

## 2016-07-02 ENCOUNTER — Other Ambulatory Visit: Payer: Self-pay

## 2016-07-02 ENCOUNTER — Ambulatory Visit (HOSPITAL_COMMUNITY): Payer: Managed Care, Other (non HMO) | Attending: Cardiology

## 2016-07-02 DIAGNOSIS — Z8249 Family history of ischemic heart disease and other diseases of the circulatory system: Secondary | ICD-10-CM | POA: Insufficient documentation

## 2016-07-02 DIAGNOSIS — I447 Left bundle-branch block, unspecified: Secondary | ICD-10-CM | POA: Insufficient documentation

## 2016-07-02 DIAGNOSIS — Z794 Long term (current) use of insulin: Secondary | ICD-10-CM | POA: Diagnosis not present

## 2016-07-02 DIAGNOSIS — E785 Hyperlipidemia, unspecified: Secondary | ICD-10-CM | POA: Insufficient documentation

## 2016-07-02 DIAGNOSIS — E119 Type 2 diabetes mellitus without complications: Secondary | ICD-10-CM | POA: Insufficient documentation

## 2016-07-02 DIAGNOSIS — I10 Essential (primary) hypertension: Secondary | ICD-10-CM | POA: Diagnosis present

## 2016-07-02 LAB — ECHOCARDIOGRAM COMPLETE
AO mean calculated velocity dopler: 157 cm/s
AOASC: 26 cm
AV Area VTI index: 0.93 cm2/m2
AV Area VTI: 1.53 cm2
AV Mean grad: 11 mmHg
AV Peak grad: 19 mmHg
AV area mean vel ind: 0.92 cm2/m2
AV peak Index: 0.92
AV vel: 1.55
AVAREAMEANV: 1.53 cm2
AVCELMEANRAT: 0.49
AVPKVEL: 217 cm/s
Ao pk vel: 0.49 m/s
CHL CUP MV DEC (S): 261
EERAT: 10.97
EWDT: 261 ms
FS: 21 % — AB (ref 28–44)
IVS/LV PW RATIO, ED: 0.84
LA ID, A-P, ES: 30 mm
LA diam end sys: 30 mm
LA vol A4C: 33.3 ml
LA vol: 37.1 mL
LADIAMINDEX: 1.8 cm/m2
LAVOLIN: 22.2 mL/m2
LV E/e' medial: 10.97
LV E/e'average: 10.97
LV PW d: 7.45 mm — AB (ref 0.6–1.1)
LV TDI E'LATERAL: 8.27
LVELAT: 8.27 cm/s
LVOT VTI: 25.8 cm
LVOT area: 3.14 cm2
LVOT diameter: 20 mm
LVOTPV: 106 cm/s
LVOTSV: 73 mL
LVOTVTI: 0.49 cm
MV Peak grad: 3 mmHg
MV pk A vel: 90.7 m/s
MV pk E vel: 90.7 m/s
RV LATERAL S' VELOCITY: 13.5 cm/s
RV TAPSE: 20.4 mm
TDI e' medial: 5.33
VTI: 52.4 cm
Valve area index: 0.93
Valve area: 1.55 cm2

## 2016-07-05 ENCOUNTER — Telehealth: Payer: Self-pay | Admitting: *Deleted

## 2016-07-05 NOTE — Telephone Encounter (Signed)
Called patient to schedule appointment to discuss test results.

## 2016-07-06 ENCOUNTER — Ambulatory Visit (INDEPENDENT_AMBULATORY_CARE_PROVIDER_SITE_OTHER): Payer: Managed Care, Other (non HMO) | Admitting: Cardiovascular Disease

## 2016-07-06 ENCOUNTER — Encounter: Payer: Self-pay | Admitting: Cardiovascular Disease

## 2016-07-06 VITALS — BP 184/84 | HR 71 | Ht 64.0 in | Wt 132.0 lb

## 2016-07-06 DIAGNOSIS — R55 Syncope and collapse: Secondary | ICD-10-CM | POA: Diagnosis not present

## 2016-07-06 DIAGNOSIS — E109 Type 1 diabetes mellitus without complications: Secondary | ICD-10-CM | POA: Diagnosis not present

## 2016-07-06 DIAGNOSIS — I447 Left bundle-branch block, unspecified: Secondary | ICD-10-CM

## 2016-07-06 DIAGNOSIS — E7849 Other hyperlipidemia: Secondary | ICD-10-CM

## 2016-07-06 DIAGNOSIS — R011 Cardiac murmur, unspecified: Secondary | ICD-10-CM | POA: Diagnosis not present

## 2016-07-06 DIAGNOSIS — E784 Other hyperlipidemia: Secondary | ICD-10-CM | POA: Diagnosis not present

## 2016-07-06 DIAGNOSIS — R42 Dizziness and giddiness: Secondary | ICD-10-CM | POA: Diagnosis not present

## 2016-07-06 DIAGNOSIS — I10 Essential (primary) hypertension: Secondary | ICD-10-CM | POA: Diagnosis not present

## 2016-07-06 DIAGNOSIS — E039 Hypothyroidism, unspecified: Secondary | ICD-10-CM | POA: Diagnosis not present

## 2016-07-06 MED ORDER — AMLODIPINE BESYLATE 5 MG PO TABS: 5.0000 mg | ORAL_TABLET | Freq: Every day | ORAL | 3 refills | Status: DC

## 2016-07-06 NOTE — Patient Instructions (Addendum)
MEDICATION CHANGES  Increase to AMLODIPINE TO 5 MG ONE TABLET   LABS IN 6 MONTHS DUE PRIOR TO OFFICE APPOINTMENTS DO NOT EAT OR DRINK THE MORNING OF THE TEST CMP ,LIPID,TSH      Your physician wants you to follow-up in 6 MONTHS WITH DR Claiborne Billings You will receive a reminder letter in the mail two months in advance. If you don't receive a letter, please call our office to schedule the follow-up appointment.    If you need a refill on your cardiac medications before your next appointment, please call your pharmacy.

## 2016-07-08 NOTE — Progress Notes (Signed)
Cardiology Office Note    Date:  07/08/2016   ID:  Brittney [name], DOB Dec 31, 1960, MRN [id number]  PCP:  Dr. Kendall Flack Cardiologist:  Shelva Majestic, MD   Chief Complaint  Patient presents with  . Follow-up    History of Present Illness:  Brittney [name] is a 56 y.o. female who is referred by Dr. Davonna Belling following emergency room evaluation for presyncope where she was found to have left bundle branch block.I saw her for initial evaluation on 06/28/2016 and she presents for follow-up evaluation.  Brittney [name] has a history of type 1 diabetes mellitus since age 40.  She is followed very closely by Dr. Joneen Roach in endocrinology at [facility].  In 1999, she was started on an insulin pump. She has a history of hypertension and was started on medication approximately 16 years ago.  She has been taking, benazapril/HCT 10/12.5 twice a day.  She has a history of hyperlipidemia and 7 years ago was started on pravastatin 20 mg daily.  She has a history of hypothyroidism for which she has been taking Synthroid 112 g daily.  She denies any awareness of chest pain, PND, orthopnea.  She is unaware of palpitations.  On 06/17/2016 she developed a near syncopal episode while at work.  Apparently, she had just walked through a patch of bugs and then started to itch all over.  She suddenly became lightheaded, but did not lose consciousness or fall.  Due to persistent symptoms, EMS was called.  Her CBG was 90.  She has an insulin pump and had eaten breakfast, but had not had lunch.  She had a similar episode in 2011 which was attributed to a vasovagal episode.  In the ER her blood pressure was 131/61.  Her pulse was 60.  She was not anemic, but white count was 17.8.  A be met was normal with a creatinine of 1.24, glucose 148.  Her ECG showed sinus rhythm with a new left bundle branch block from her 2011 ECG.  In the emergency room, she had a negative workup.  A chest x-ray was negative.  Troponin was negative.   Because of her new left bundle branch block, she is now referred for cardiology consultation and evaluation.  Upon arrival to the emergency room, she denied fevers, chills, headache, visual changes, cough, shortness of breath, chest pain, palpitations, abdominal pain, nausea, or vomiting.  She denied any paresthesias.  While in the emergency room, her blood pressure was 131/61.  Her pulse was 60.  She had negative orthostatics.  Her CBG was 178.  Laboratory revealed an elevated white blood count at 17.8 with a normal hemoglobin and hematocrit.  Cr was 1.24 with an anion gap of 17, which was felt possibly due to mild dehydration.  Her chest x-ray was negative.  Troponin was negative.  Her ECG showed sinus rhythm but there was a new left bundle branch block.  Compared to her prior ECG of 2011.  Because of this new left bundle branch block.  She was  referred for cardiology consultation and evaluation.  When I saw her initially, she was hypertensive and recommended initiation of low-dose amlodipine at 2.5 mg daily for more optimal blood pressure control.  She had a 2/6 systolic murmur left sternal border and is scheduled her for an echo Doppler study.  This demonstrated normal LV function with mild grade 1 diastolic dysfunction.  The left ventricle outflow tract below the aortic valve.  There was a  subvalvular calcified ridge which created turbulence resulting in a mean gradient of 11.  There was no valvular aortic stenosis.  She underwent repeat laboratory which revealed a total cholesterol 181, triglycerides 58, HDL 83, and LDL 86.  With her long-standing diabetes mellitus, history I referred her for nuclear stress test.  This revealed normal perfusion with an ejection fraction of 58% without evidence for scar or ischemia.  She presents now for follow-up evaluation.   Past Medical History:  Diagnosis Date  . Diabetes mellitus    has insulin pump  . Endocervical polyp   . Hyperlipidemia   . Hypertension     . Hypothyroidism   . Increased blood pressure (not hypertension)   . Oligomenorrhea   . Thyroid disease   . Type 1 diabetes mellitus ([place])   . Uterine enlargement   . Uterine fibroid     Past Surgical History:  Procedure Laterality Date  . CESAREAN SECTION    . MYOMECTOMY ABDOMINAL APPROACH      Current Medications: Outpatient Medications Prior to Visit  Medication Sig Dispense Refill  . aspirin 81 MG tablet Take 81 mg by mouth daily.      . benazepril-hydrochlorthiazide (LOTENSIN HCT) 10-12.5 MG per tablet Take 1 tablet by mouth Twice daily.    . calcium carbonate (OS-CAL) 600 MG TABS Take 600 mg by mouth daily.      Marland Kitchen HUMALOG 100 UNIT/ML injection Inject into the skin Continuous. Via pump    . Multiple Vitamins-Minerals (MULTIVITAMIN WITH MINERALS) tablet Take 1 tablet by mouth daily.      . pravastatin (PRAVACHOL) 20 MG tablet TAKE 1 TABLET EVERY EVENING 30 tablet 0  . SYNTHROID 112 MCG tablet Take 1 tablet by mouth Daily.    Marland Kitchen amLODipine (NORVASC) 2.5 MG tablet Take 1 tablet (2.5 mg total) by mouth at bedtime. 30 tablet 6   No facility-administered medications prior to visit.      Allergies:   Patient has no known allergies.   Social History   Social History  . Marital status: Married    Spouse name: N/A  . Number of children: N/A  . Years of education: N/A   Social History Main Topics  . Smoking status: Never Smoker  . Smokeless tobacco: Never Used  . Alcohol use No  . Drug use: No  . Sexual activity: Yes    Birth control/ protection: Post-menopausal   Other Topics Concern  . None   Social History Narrative  . None    Socially, she works in the [employer] at [employer] day [employer].  She has a Scientist, water quality.  There is no tobacco history.  She very rarely has a glass of wine.  She does exercise daily, doing elliptical, running or walking 7 days per week for at least 30 minutes a session.  She also uses weight machine 2 days per week.  Family  History:  The patient's family history includes Diabetes in her father, maternal grandfather, paternal grandfather, and sister; Hypertension in her father and sister; Hypothyroidism in her sister.  .  She has a twin sister who also has type 1 diabetes mellitus, hypertension and hypothyroidism.  Her father died at age 59 and had diabetes, hypertension, and suffered a heart attack at age 47.  She has a daughter age 53.  ROS General: Negative; No fevers, chills, or night sweats;  HEENT: Negative; No changes in vision or hearing, sinus congestion, difficulty swallowing Pulmonary: Negative; No cough, wheezing, shortness of breath, hemoptysis  Cardiovascular: no episodes of dizziness or chest pain. GI: Negative; No nausea, vomiting, diarrhea, or abdominal pain GU: Negative; No dysuria, hematuria, or difficulty voiding Musculoskeletal: Negative; no myalgias, joint pain, or weakness Hematologic/Oncology: Negative; no easy bruising, bleeding Endocrine: Negative; no heat/cold intolerance; no diabetes Neuro: Negative; no changes in balance, headaches Skin: Negative; No rashes or skin lesions Psychiatric: Negative; No behavioral problems, depression Sleep: Negative; No snoring, daytime sleepiness, hypersomnolence, bruxism, restless legs, hypnogognic hallucinations, no cataplexy Other comprehensive 14 point system review is negative.   PHYSICAL EXAM:   VS:  BP (!) 184/84   Pulse 71   Ht _0  (1.626 m)   Wt 132 lb (59.9 kg)   BMI 22.66 kg/m     Repeat blood pressure by me was 162/80  Wt Readings from Last 3 Encounters:  07/06/16 132 lb (59.9 kg)  06/24/16 134 lb (60.8 kg)  06/21/16 134 lb (60.8 kg)    General: Alert, oriented, no distress.  Skin: normal turgor, no rashes, warm and dry HEENT: Normocephalic, atraumatic. Pupils equal round and reactive to light; sclera anicteric; extraocular muscles intact; Fundi without obvious retinopathy.  Disc flat Nose without nasal septal  hypertrophy Mouth/Parynx benign; Mallinpatti scale 2 Neck: No JVD, no carotid bruits; normal carotid upstroke Lungs: clear to ausculatation and percussion; no wheezing or rales Chest wall: without tenderness to palpitation Heart: PMI not displaced, RRR, s1 s2 normal, 2/6 systolic murmur at the left sternal border and apex,unchanged from prior exam;  no diastolic murmur, no rubs, gallops, thrills, or heaves Abdomen: soft, nontender; no hepatosplenomehaly, BS+; abdominal aorta nontender and not dilated by palpation.  Insulin pump. Back: no CVA tenderness Pulses 2+ Musculoskeletal: full range of motion, normal strength, no joint deformities Extremities: no clubbing cyanosis or edema, Homan's sign negative  Neurologic: grossly nonfocal; Cranial nerves grossly wnl Psychologic: Normal mood and affect   Studies/Labs Reviewed:   Jun 28 2016 ECG (independently read by me):  Normal sinus rhythm at 77 bpm.  Left bundle branch block with repolarization changes.  PR interval 148 ms; QTc interval 454 ms.  Recent Labs: BMP Latest Ref Rng & Units 06/21/2016 06/17/2016 11/14/2009  Glucose 65 - 99 mg/dL 196(H) 148(H) 231(H)  BUN 7 - 25 mg/dL _1 Creatinine 0.50 - 1.05 mg/dL 1.23(H) 1.24(H) 1.08  Sodium 135 - 146 mmol/L 139 139 137  Potassium 3.5 - 5.3 mmol/L 4.5 3.5 3.9  Chloride 98 - 110 mmol/L 100 99(L) 105  CO2 20 - 31 mmol/L _2 Calcium 8.6 - 10.4 mg/dL 9.8 9.8 8.5     Hepatic Function Latest Ref Rng & Units 06/21/2016  Total Protein 6.1 - 8.1 g/dL 7.0  Albumin 3.6 - 5.1 g/dL 4.2  AST 10 - 35 U/L 25  ALT 6 - 29 U/L 28  Alk Phosphatase 33 - 130 U/L 64  Total Bilirubin 0.2 - 1.2 mg/dL 0.5    CBC Latest Ref Rng & Units 06/17/2016 11/14/2009 11/13/2009  WBC 4.0 - 10.5 K/uL 17.8(H) 8.4 16.6(H)  Hemoglobin 12.0 - 15.0 g/dL 16.2(H) 12.9 15.3(H)  Hematocrit 36.0 - 46.0 % 47.1(H) 37.4 44.3  Platelets 150 - 400 K/uL 217 177 186   Lab Results  Component Value Date   MCV 88.9 06/17/2016    MCV 88.0 11/14/2009   MCV 90.0 11/13/2009   Lab Results  Component Value Date   TSH 4.56 (H) 06/21/2016   No results found for: HGBA1C   BNP No results found for: BNP  ProBNP No results found for: PROBNP   Lipid Panel     Component Value Date/Time   CHOL 181 06/21/2016 0815   TRIG 58 06/21/2016 0815   HDL 83 06/21/2016 0815   CHOLHDL 2.2 06/21/2016 0815   VLDL 12 06/21/2016 0815   LDLCALC 86 06/21/2016 0815   LDLDIRECT 115.1 02/03/2010 0835     RADIOLOGY: Dg Chest 2 View  Result Date: 06/17/2016 CLINICAL DATA:  Near syncope EXAM: CHEST  2 VIEW COMPARISON:  None. FINDINGS: Lungs are hyperexpanded. The lungs are clear wiithout focal pneumonia, edema, pneumothorax or pleural effusion. The cardiopericardial silhouette is within normal limits for size. The visualized bony structures of the thorax are intact. Telemetry leads overlie the chest. IMPRESSION: No active cardiopulmonary disease. Electronically Signed   By: Misty Stanley M.D.   On: 06/17/2016 14:53     Additional studies/ records that were reviewed today include:  I reviewed the patient's ER evaluation in detail as well as laboratory. Since her initial visit, I reviewed an echo Doppler study from 11/14/2009 as well as her most recent echo, nuclear stress test, and laboratory.    ASSESSMENT:    1. Essential hypertension   2. Other hyperlipidemia   3. Acquired hypothyroidism   4. Postural dizziness with presyncope   5. LBBB (left bundle branch block)   6. Type 1 diabetes mellitus without complication (HCC)   7. Cardiac murmur      PLAN:  Brittney [name] is a 56 year old female who has a history of type 1 diabetes mellitus since age 60 and has been on insulin pump since 1999.  She has a history of hypertension for 16 years and has been on HCT since that time, has a history of hyperlipidemia, currently on pravastatin, as well as a history of hypothyroidism on Synthroid replacement.  She recently developed an  episode of lightheadedness which seemed to occur after she had inadvertently walked through a nest of bugs.  She noticed itching and pruritus in the possibility of transient associated vasodilation in etiology of her event.  At the time of her evaluation, she was not hypoglycemic and she did not have orthostatic hypotension.  She was found to have a new left bundle branch block which is new compared to her 2011 ECG.when I initially saw her, she was hypertensive and I recommended initial therapy with amlodipine and started her in a very low dose of 2.5 mg.  Her blood pressure today continues to be elevated and I have recommended further titration to 5 mg daily.  She had a 2/6 systolic murmur.  I wanted to make certain there was no evidence for aortic stenosis.  I reviewed her most recent echo Doppler study from 07/02/2016 which showed normal systolic function with grade 1 diastolic dysfunction.  Aortic valve was mildly thickened and mildly calcified but there was no stenosis or reduced excursion.  There was a subvalvular ridge noted creating turbulence Doppler flow but no significant elevated transvalvular velocity.  For comparison, I was able to obtain the 2011 echo Doppler study.  In that study, there was no mention of any sub-valvular ridge and she did not have any gradient.  I reviewed her nuclear stress test which was excellent and showed normal perfusion.  She is on pravastatin 20 mg for hyperlipidemia with an LDL of 86.  Target LDL is less than 70 in this diabetic female.  She continues to be on very low-dose pravastatin.  She will be having a follow-up visit at [facility].  It may be worthwhile in the future to change her 2 more potent statin therapy or further titrate pravastatin to reach goal. In 6 months.  I will see her for follow up evaluation prior to office visit.  Repeat laboratory will be obtained.   Medication Adjustments/Labs and Tests Ordered: Current medicines are reviewed at length with the  patient today.  Concerns regarding medicines are outlined above.  Medication changes, Labs and Tests ordered today are listed in the Patient Instructions below.  Patient Instructions  MEDICATION CHANGES  Increase to AMLODIPINE TO 5 MG ONE TABLET   LABS IN 6 MONTHS DUE PRIOR TO OFFICE APPOINTMENTS DO NOT EAT OR DRINK THE MORNING OF THE TEST CMP ,LIPID,TSH      Your physician wants you to follow-up in 6 MONTHS WITH DR Claiborne Billings You will receive a reminder letter in the mail two months in advance. If you don't receive a letter, please call our office to schedule the follow-up appointment.    If you need a refill on your cardiac medications before your next appointment, please call your pharmacy.        Signed, Shelva Majestic, MD, [facility]  07/08/2016 8:20 PM    [place] [address] [place], [place], [place]  [address] Phone: [phone]

## 2017-01-20 ENCOUNTER — Telehealth: Payer: Self-pay | Admitting: *Deleted

## 2017-01-20 DIAGNOSIS — E079 Disorder of thyroid, unspecified: Secondary | ICD-10-CM

## 2017-01-20 DIAGNOSIS — E7849 Other hyperlipidemia: Secondary | ICD-10-CM

## 2017-01-20 DIAGNOSIS — I10 Essential (primary) hypertension: Secondary | ICD-10-CM

## 2017-01-20 NOTE — Telephone Encounter (Signed)
MAILED LETTER AND LABSLIP . REQUEST FOR PATIENT TO CALL AN MAKE AN APPOINTMENT TO FOLLOW UP RESULTS

## 2017-01-20 NOTE — Telephone Encounter (Signed)
-----   Message from Brittney Simmonds, RN sent at 07/06/2016 12:33 PM EDT ----- DR Claiborne Billings PATIENT  NEED CMP,LIPID,TSH MAILED 12/06/16 DUE 01/06/17

## 2017-02-08 LAB — COMPREHENSIVE METABOLIC PANEL
A/G RATIO: 1.9 (ref 1.2–2.2)
ALT: 32 IU/L (ref 0–32)
AST: 32 IU/L (ref 0–40)
Albumin: 4.7 g/dL (ref 3.5–5.5)
Alkaline Phosphatase: 79 IU/L (ref 39–117)
BUN/Creatinine Ratio: 20 (ref 9–23)
BUN: 26 mg/dL — ABNORMAL HIGH (ref 6–24)
Bilirubin Total: 0.4 mg/dL (ref 0.0–1.2)
CALCIUM: 10.2 mg/dL (ref 8.7–10.2)
CO2: 25 mmol/L (ref 20–29)
Chloride: 99 mmol/L (ref 96–106)
Creatinine, Ser: 1.33 mg/dL — ABNORMAL HIGH (ref 0.57–1.00)
GFR calc Af Amer: 51 mL/min/{1.73_m2} — ABNORMAL LOW (ref >59)
GFR, EST NON AFRICAN AMERICAN: 44 mL/min/{1.73_m2} — AB (ref >59)
Globulin, Total: 2.5 g/dL (ref 1.5–4.5)
Glucose: 135 mg/dL — ABNORMAL HIGH (ref 65–99)
Potassium: 4.2 mmol/L (ref 3.5–5.2)
Sodium: 139 mmol/L (ref 134–144)
TOTAL PROTEIN: 7.2 g/dL (ref 6.0–8.5)

## 2017-02-08 LAB — LIPID PANEL
CHOLESTEROL TOTAL: 190 mg/dL (ref 100–199)
Chol/HDL Ratio: 2.2 ratio (ref 0.0–4.4)
HDL: 87 mg/dL (ref >39)
LDL Calculated: 92 mg/dL (ref 0–99)
TRIGLYCERIDES: 55 mg/dL (ref 0–149)
VLDL Cholesterol Cal: 11 mg/dL (ref 5–40)

## 2017-02-08 LAB — TSH: TSH: 1.48 u[IU]/mL (ref 0.450–4.500)

## 2017-03-14 NOTE — Progress Notes (Signed)
Cardiology Office Note   Date:  03/15/2017   ID:  Brittney [name], DOB [birth year], MRN [id number]  PCP:  System, Pcp Not In  Cardiologist: Dr. Claiborne Billings  Chief Complaint  Patient presents with  . Follow-up  . Hypertension     History of Present Illness: Brittney [name] is a 56 y.o. female who presents for ongoing assessment and management of hypertension, history of near syncope, type 1 diabetes, chronic left bundle branch block.  Most recent echocardiogram revealed normal LV systolic function with mild grade 1 diastolic dysfunction.  She did have some subvalvular aortic valve turbulence resulting in a high gradient of 11.  There was no aortic valve stenosis.  The patient did have a nuclear medicine stress test due to cardiovascular risk factors. Nuclear medicine stress test dated 06/24/2016 revealed low risk study, with normal EF and no evidence of ischemia.  Today she is without any cardiac complaints.  She denies chest pain dyspnea or fatigue.  She is also followed by an endocrinologist, and is currently on a insulin pump.  She has questions about recent labs in December.  She has had no new diagnoses, no new medications added, and no pending surgeries.  Past Medical History:  Diagnosis Date  . Diabetes mellitus    has insulin pump  . Endocervical polyp   . Hyperlipidemia   . Hypertension   . Hypothyroidism   . Increased blood pressure (not hypertension)   . Oligomenorrhea   . Thyroid disease   . Type 1 diabetes mellitus ([place])   . Uterine enlargement   . Uterine fibroid     Past Surgical History:  Procedure Laterality Date  . CESAREAN SECTION    . MYOMECTOMY ABDOMINAL APPROACH       Current Outpatient Medications  Medication Sig Dispense Refill  . aspirin 81 MG tablet Take 81 mg by mouth daily.      . benazepril-hydrochlorthiazide (LOTENSIN HCT) 10-12.5 MG per tablet Take 1 tablet by mouth Twice daily.    . calcium carbonate (OS-CAL) 600 MG TABS Take 600 mg by mouth daily.      Marland Kitchen  HUMALOG 100 UNIT/ML injection Inject into the skin Continuous. Via pump    . Multiple Vitamins-Minerals (MULTIVITAMIN WITH MINERALS) tablet Take 1 tablet by mouth daily.      . pravastatin (PRAVACHOL) 20 MG tablet TAKE 1 TABLET EVERY EVENING 30 tablet 0  . SYNTHROID 112 MCG tablet Take 1 tablet by mouth Daily.    Marland Kitchen amLODipine (NORVASC) 5 MG tablet Take 1 tablet (5 mg total) by mouth daily. 90 tablet 3   No current facility-administered medications for this visit.     Allergies:   Patient has no known allergies.    Social History:  The patient  reports that  has never smoked. she has never used smokeless tobacco. She reports that she does not drink alcohol or use drugs.   Family History:  The patient's family history includes Diabetes in her father, maternal grandfather, paternal grandfather, and sister; Hypertension in her father and sister; Hypothyroidism in her sister.    ROS: All other systems are reviewed and negative. Unless otherwise mentioned in H&P    PHYSICAL EXAM: VS:  BP 140/73   Pulse 83   Ht 5' 3.5" (1.613 m)   Wt 134 lb 3.2 oz (60.9 kg)   BMI 23.40 kg/m  , BMI Body mass index is 23.4 kg/m. GEN: Well nourished, well developed, in no acute distress  HEENT: normal  Neck:  no JVD, carotid bruits, or masses Cardiac: RRR; 1/6 systolic murmur heard at the right and left sternal borders and in the apex.  Murmurs, rubs, or gallops,no edema  Respiratory:  clear to auscultation bilaterally, normal work of breathing GI: soft, nontender, nondistended, + BS MS: no deformity or atrophy  Skin: warm and dry, no rash Neuro:  Strength and sensation are intact Psych: euthymic mood, full affect  Recent Labs: 06/17/2016: Hemoglobin 16.2; Platelets 217 02/07/2017: ALT 32; BUN 26; Creatinine, Ser 1.33; Potassium 4.2; Sodium 139; TSH 1.480    Lipid Panel    Component Value Date/Time   CHOL 190 02/07/2017 0818   TRIG 55 02/07/2017 0818   HDL 87 02/07/2017 0818   CHOLHDL 2.2  02/07/2017 0818   CHOLHDL 2.2 06/21/2016 0815   VLDL 12 06/21/2016 0815   LDLCALC 92 02/07/2017 0818   LDLDIRECT 115.1 02/03/2010 0835      Wt Readings from Last 3 Encounters:  03/15/17 134 lb 3.2 oz (60.9 kg)  07/06/16 132 lb (59.9 kg)  06/24/16 134 lb (60.8 kg)      Other studies Reviewed: Echocardiogram 2016/07/24 Left ventricle: The cavity size was normal. Wall thickness was   normal. Systolic function was normal. The estimated ejection   fraction was in the range of 60% to 65%. Wall motion was normal;   there were no regional wall motion abnormalities. Doppler   parameters are consistent with abnormal left ventricular   relaxation (grade 1 diastolic dysfunction). There is subvalvular   calcified ridge noted in outflow tract. - Aortic valve: Mildly thickened, mildly calcified leaflets. The   subvalvular ridge noted is demonstrating turbulent Doppler color   flow but no significant elevated transvalvular velocity. Peak   velocity (S): 217 cm/s. Mean gradient (S): 11 mm Hg.   ASSESSMENT AND PLAN:  1.  Hypertension: Blood pressure slightly elevated today.  She brings with her copies of her home blood pressure readings which are in the 299M systolic over 42A.  No changes in her medication regimen at this time.  She will continue amlodipine and benazepril HCTZ.  I reviewed her labs with her and saw that her creatinine was 1.3.  I have advised her to continue to follow this with her PCP.  We may need to consider taking her off HCTZ if kidney function worsens in the future.  2.  Valvular heart disease: The patient has Nordic valve calcifications but no elevated transvalvular velocity.  I will repeat her echocardiogram in 6 months for ongoing assessment of this.  3.  Hypercholesterolemia: The patient continues on pravastatin 20 mg daily.  If creatinine continues to elevate may consider changing to atorvastatin.  4.  Insulin-dependent diabetes: Patient has an insulin pump.  I printed  out her labs so that she can take them to her endocrinologist in the next week so that she will have a record of this for herself.   Current medicines are reviewed at length with the patient today.    Labs/ tests ordered today include: Echocardiogram  Phill Myron. West Pugh, ANP, AACC   03/15/2017 9:21 AM    [facility] [address], [place], [place] [address] Phone: [phone]; Fax: [phone]

## 2017-03-15 ENCOUNTER — Encounter: Payer: Self-pay | Admitting: Adult Health

## 2017-03-15 ENCOUNTER — Ambulatory Visit: Payer: Managed Care, Other (non HMO) | Admitting: Adult Health

## 2017-03-15 VITALS — BP 140/73 | HR 83 | Ht 63.5 in | Wt 134.2 lb

## 2017-03-15 DIAGNOSIS — I38 Endocarditis, valve unspecified: Secondary | ICD-10-CM

## 2017-03-15 DIAGNOSIS — I10 Essential (primary) hypertension: Secondary | ICD-10-CM | POA: Diagnosis not present

## 2017-03-15 DIAGNOSIS — I358 Other nonrheumatic aortic valve disorders: Secondary | ICD-10-CM

## 2017-03-15 DIAGNOSIS — I447 Left bundle-branch block, unspecified: Secondary | ICD-10-CM | POA: Diagnosis not present

## 2017-03-15 NOTE — Patient Instructions (Signed)
Medication Instructions:  NO CHANGES-Your physician recommends that you continue on your current medications as directed. Please refer to the Current Medication list given to you today.  If you need a refill on your cardiac medications before your next appointment, please call your pharmacy.  Testing/Procedures: Echocardiogram - Your physician has requested that you have an echocardiogram. Echocardiography is a painless test that uses sound waves to create images of your heart. It provides your doctor with information about the size and shape of your heart and how well your heart's chambers and valves are working. This procedure takes approximately one hour. There are no restrictions for this procedure. This will be performed at our [facility] location - [address], [address].  Follow-Up: Your physician wants you to follow-up in: 6 MONTHS WITH DR Claiborne Billings, WITH ECHO BEFORE TO REVIEW AT APPOINTMENT. You should receive a reminder letter in the mail two months in advance. If you do not receive a letter, please call our office MAY 2019 to schedule the July 2019 follow-up appointment.   Thank you for choosing [facility] at [facility]!!

## 2017-07-16 ENCOUNTER — Other Ambulatory Visit: Payer: Self-pay | Admitting: Cardiovascular Disease

## 2017-07-19 NOTE — Telephone Encounter (Signed)
Rx sent to pharmacy   

## 2017-09-06 ENCOUNTER — Other Ambulatory Visit: Payer: Self-pay

## 2017-09-06 ENCOUNTER — Ambulatory Visit (HOSPITAL_COMMUNITY): Payer: Managed Care, Other (non HMO) | Attending: Cardiovascular Disease

## 2017-09-06 DIAGNOSIS — I38 Endocarditis, valve unspecified: Secondary | ICD-10-CM

## 2017-09-06 DIAGNOSIS — E785 Hyperlipidemia, unspecified: Secondary | ICD-10-CM | POA: Insufficient documentation

## 2017-09-06 DIAGNOSIS — I10 Essential (primary) hypertension: Secondary | ICD-10-CM | POA: Insufficient documentation

## 2017-09-06 DIAGNOSIS — E119 Type 2 diabetes mellitus without complications: Secondary | ICD-10-CM | POA: Insufficient documentation

## 2017-09-06 DIAGNOSIS — I340 Nonrheumatic mitral (valve) insufficiency: Secondary | ICD-10-CM | POA: Insufficient documentation

## 2017-09-06 DIAGNOSIS — E039 Hypothyroidism, unspecified: Secondary | ICD-10-CM | POA: Diagnosis not present

## 2017-10-09 ENCOUNTER — Other Ambulatory Visit: Payer: Self-pay | Admitting: Cardiovascular Disease

## 2017-10-10 NOTE — Telephone Encounter (Signed)
Rx sent to pharmacy   

## 2018-01-20 ENCOUNTER — Other Ambulatory Visit: Payer: Self-pay | Admitting: Cardiovascular Disease

## 2018-04-26 ENCOUNTER — Encounter: Payer: Self-pay | Admitting: Cardiovascular Disease

## 2018-04-26 ENCOUNTER — Ambulatory Visit: Payer: Managed Care, Other (non HMO) | Admitting: Cardiovascular Disease

## 2018-04-26 VITALS — BP 110/60 | HR 81 | Ht 63.5 in | Wt 135.8 lb

## 2018-04-26 DIAGNOSIS — I10 Essential (primary) hypertension: Secondary | ICD-10-CM

## 2018-04-26 DIAGNOSIS — I447 Left bundle-branch block, unspecified: Secondary | ICD-10-CM

## 2018-04-26 DIAGNOSIS — E785 Hyperlipidemia, unspecified: Secondary | ICD-10-CM

## 2018-04-26 DIAGNOSIS — E109 Type 1 diabetes mellitus without complications: Secondary | ICD-10-CM | POA: Diagnosis not present

## 2018-04-26 DIAGNOSIS — R011 Cardiac murmur, unspecified: Secondary | ICD-10-CM | POA: Diagnosis not present

## 2018-04-26 MED ORDER — ROSUVASTATIN CALCIUM 10 MG PO TABS: 10.0000 mg | ORAL_TABLET | Freq: Every day | ORAL | 3 refills | Status: DC

## 2018-04-26 NOTE — Progress Notes (Signed)
Cardiology Office Note    Date:  04/26/2018   ID:  Brittney [name], DOB 1960/06/08, MRN [id number]  PCP:  Dr. Kendall Flack Cardiologist:  Shelva Majestic, MD   No chief complaint on file.   History of Present Illness:  Brittney [name] is a 58 y.o. female who is referred by Dr. Davonna Belling following emergency room evaluation for presyncope where she was found to have left bundle branch block.I saw her for initial evaluation on 06/28/2016.  She presents for follow-up evaluation.  Brittney [name] has a history of type 1 diabetes mellitus since age 62.  She is followed very closely by Dr. Joneen Roach in endocrinology at [facility].  In 1999, she was started on an insulin pump. She has a history of hypertension and was started on medication approximately 16 years ago.  She has been taking, benazapril/HCT 10/12.5 twice a day.  She has a history of hyperlipidemia and 7 years ago was started on pravastatin 20 mg daily.  She has a history of hypothyroidism for which she has been taking Synthroid 112 g daily.  She denies any awareness of chest pain, PND, orthopnea.  She is unaware of palpitations.  On 06/17/2016 she developed a near syncopal episode while at work.  Apparently, she had just walked through a patch of bugs and then started to itch all over.  She suddenly became lightheaded, but did not lose consciousness or fall.  Due to persistent symptoms, EMS was called.  Her CBG was 90.  She has an insulin pump and had eaten breakfast, but had not had lunch.  She had a similar episode in 2011 which was attributed to a vasovagal episode.  In the ER her blood pressure was 131/61.  Her pulse was 60.  She was not anemic, but white count was 17.8.  A bmet was normal with a creatinine of 1.24, glucose 148.  Her ECG showed sinus rhythm with a new left bundle branch block from her 2011 ECG.  In the emergency room, she had a negative workup.  A chest x-ray was negative.  Troponin was negative.  Because of her new left bundle  branch block, she is now referred for cardiology consultation and evaluation.  Upon arrival to the emergency room, she denied fevers, chills, headache, visual changes, cough, shortness of breath, chest pain, palpitations, abdominal pain, nausea, or vomiting.  She denied any paresthesias.  While in the emergency room, her blood pressure was 131/61.  Her pulse was 60.  She had negative orthostatics.  Her CBG was 178.  Laboratory revealed an elevated white blood count at 17.8 with a normal hemoglobin and hematocrit.  Cr was 1.24 with an anion gap of 17, which was felt possibly due to mild dehydration.  Her chest x-ray was negative.  Troponin was negative.  Her ECG showed sinus rhythm but there was a new left bundle branch block.  Compared to her prior ECG of 2011.  Because of this new left bundle branch block.  She was  referred for cardiology consultation and evaluation.  When I saw her initially, she was hypertensive and recommended initiation of low-dose amlodipine at 2.5 mg daily for more optimal blood pressure control.  She had a 2/6 systolic murmur left sternal border and is scheduled her for an echo Doppler study.  This demonstrated normal LV function with mild grade 1 diastolic dysfunction.  The left ventricle outflow tract below the aortic valve.  There was a subvalvular calcified ridge which created turbulence  resulting in a mean gradient of 11.  There was no valvular aortic stenosis.  She underwent repeat laboratory which revealed a total cholesterol 181, triglycerides 58, HDL 83, and LDL 86.  With her long-standing diabetes mellitus, history I referred her for nuclear stress test.  This revealed normal perfusion with an ejection fraction of 58% without evidence for scar or ischemia.   Since I last saw her, she was seen in January 2019 by Brittney Sims, NP and remained fairly stable.  She underwent a follow-up echo Doppler study in July 2019 which continue to show normal systolic function with an EF  of 55 to 60%.  There was systolic bowing of her mitral valve without prolapse with mild MR directed eccentrically and posteriorly.  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  She is monitored by Dr. Joneen Roach at [facility] at six-month intervals for her type 1 diabetes mellitus.  She brought with her complete set of laboratory from February 05/2018.  Of note, her lipids had increased and her total cholesterol was 227 with an LDL cholesterol of 126, HDL cholesterol at 87 and triglycerides at 70.  Laboratory was otherwise normal except there was very mild urine microalbumin.  She continues to be on pravastatin at 20 mg amlodipine 5 mg, benazepril/HCTZ 10/12.5 mg in addition to her levothyroxine 112 mcg and Humalog insulin via pump.  She denies chest pain PND orthopnea.  She denies palpitations.  She denies presyncope or syncope.  She presents for reevaluation  Past Medical History:  Diagnosis Date  . Diabetes mellitus    has insulin pump  . Endocervical polyp   . Hyperlipidemia   . Hypertension   . Hypothyroidism   . Increased blood pressure (not hypertension)   . Oligomenorrhea   . Thyroid disease   . Type 1 diabetes mellitus ([place])   . Uterine enlargement   . Uterine fibroid     Past Surgical History:  Procedure Laterality Date  . CESAREAN SECTION    . MYOMECTOMY ABDOMINAL APPROACH      Current Medications: Outpatient Medications Prior to Visit  Medication Sig Dispense Refill  . amLODipine (NORVASC) 5 MG tablet TAKE 1 TABLET (5 MG TOTAL) BY MOUTH DAILY. PLEASE CALL TO MAKE FOLLOW UP APPOINTMENT. 90 tablet 0  . aspirin 81 MG tablet Take 81 mg by mouth daily.      . benazepril-hydrochlorthiazide (LOTENSIN HCT) 10-12.5 MG per tablet Take 1 tablet by mouth Twice daily.    . calcium carbonate (OS-CAL) 600 MG TABS Take 600 mg by mouth daily.      Marland Kitchen HUMALOG 100 UNIT/ML  injection Inject into the skin Continuous. Via pump    . Multiple Vitamins-Minerals (MULTIVITAMIN WITH MINERALS) tablet Take 1 tablet by mouth daily.      Marland Kitchen SYNTHROID 112 MCG tablet Take 1 tablet by mouth Daily.    . pravastatin (PRAVACHOL) 20 MG tablet TAKE 1 TABLET EVERY EVENING 30 tablet 0   No facility-administered medications prior to visit.      Allergies:   Patient has no known allergies.   Social History   Socioeconomic History  . Marital status: Married    Spouse name: Not on file  . Number of children: Not on file  . Years of education: Not on file  . Highest education level: Not on file  Occupational History  . Not on file  Social Needs  .  Financial resource strain: Not on file  . Food insecurity:    Worry: Not on file    Inability: Not on file  . Transportation needs:    Medical: Not on file    Non-medical: Not on file  Tobacco Use  . Smoking status: Never Smoker  . Smokeless tobacco: Never Used  Substance and Sexual Activity  . Alcohol use: No  . Drug use: No  . Sexual activity: Yes    Birth control/protection: Post-menopausal  Lifestyle  . Physical activity:    Days per week: Not on file    Minutes per session: Not on file  . Stress: Not on file  Relationships  . Social connections:    Talks on phone: Not on file    Gets together: Not on file    Attends religious service: Not on file    Active member of club or organization: Not on file    Attends meetings of clubs or organizations: Not on file    Relationship status: Not on file  Other Topics Concern  . Not on file  Social History Narrative  . Not on file    Socially, she works in the [employer] office at [employer] day [employer].  She has a Scientist, water quality.  There is no tobacco history.  She very rarely has a glass of wine.  She does exercise daily, doing elliptical, running or walking 7 days per week for at least 30 minutes a session.  She also uses weight machine 2 days per week.  Family History:   The patient's family history includes Diabetes in her father, maternal grandfather, paternal grandfather, and sister; Hypertension in her father and sister; Hypothyroidism in her sister.  .  She has a twin sister who also has type 1 diabetes mellitus, hypertension and hypothyroidism.  Her father died at age 7 and had diabetes, hypertension, and suffered a heart attack at age 41.  She has a daughter age 49.  ROS General: Negative; No fevers, chills, or night sweats;  HEENT: Negative; No changes in vision or hearing, sinus congestion, difficulty swallowing Pulmonary: Negative; No cough, wheezing, shortness of breath, hemoptysis Cardiovascular: no episodes of dizziness or chest pain. GI: Negative; No nausea, vomiting, diarrhea, or abdominal pain GU: Negative; No dysuria, hematuria, or difficulty voiding Musculoskeletal: Negative; no myalgias, joint pain, or weakness Hematologic/Oncology: Negative; no easy bruising, bleeding Endocrine: Type 1 diabetes mellitus Neuro: Negative; no changes in balance, headaches Skin: Negative; No rashes or skin lesions Psychiatric: Negative; No behavioral problems, depression Sleep: Negative; No snoring, daytime sleepiness, hypersomnolence, bruxism, restless legs, hypnogognic hallucinations, no cataplexy Other comprehensive 14 point system review is negative.   PHYSICAL EXAM:   VS:  BP 110/60   Pulse 81   Ht 5' 3.5" (1.613 m)   Wt 135 lb 12.8 oz (61.6 kg)   BMI 23.68 kg/m     Repeat blood pressure by me was 125/70  Wt Readings from Last 3 Encounters:  04/26/18 135 lb 12.8 oz (61.6 kg)  03/15/17 134 lb 3.2 oz (60.9 kg)  07/06/16 132 lb (59.9 kg)    General: Alert, oriented, no distress.  Skin: normal turgor, no rashes, warm and dry HEENT: Normocephalic, atraumatic. Pupils equal round and reactive to light; sclera anicteric; extraocular muscles intact;  Nose without nasal septal hypertrophy Mouth/Parynx benign; Mallinpatti scale 2 Neck: No JVD, no  carotid bruits; normal carotid upstroke Lungs: clear to ausculatation and percussion; no wheezing or rales Chest wall: without tenderness to palpitation Heart: PMI not  displaced, RRR, s1 s2 normal, 1-2 /6 systolic murmur loudest lower sternal border and  apex, no diastolic murmur, no rubs, gallops, thrills, or heaves Abdomen: Insulin pump; soft, nontender; no hepatosplenomehaly, BS+; abdominal aorta nontender and not dilated by palpation. Back: no CVA tenderness Pulses 2+ Musculoskeletal: full range of motion, normal strength, no joint deformities Extremities: no clubbing cyanosis or edema, Homan's sign negative  Neurologic: grossly nonfocal; Cranial nerves grossly wnl Psychologic: Normal mood and affect   Studies/Labs Reviewed:   ECG (independently read by me): Normal sinus rhythm at 81 bpm.  Left bundle branch block with repolarization changes.  QTc interval 492 ms.  Jun 28 2016 ECG (independently read by me):  Normal sinus rhythm at 77 bpm.  Left bundle branch block with repolarization changes.  PR interval 148 ms; QTc interval 454 ms.  Recent Labs: BMP Latest Ref Rng & Units 02/07/2017 06/21/2016 06/17/2016  Glucose 65 - 99 mg/dL 135(H) 196(H) 148(H)  BUN 6 - 24 mg/dL 26(H) 19 19  Creatinine 0.57 - 1.00 mg/dL 1.33(H) 1.23(H) 1.24(H)  BUN/Creat Ratio 9 - 23 20 - -  Sodium 134 - 144 mmol/L 139 139 139  Potassium 3.5 - 5.2 mmol/L 4.2 4.5 3.5  Chloride 96 - 106 mmol/L 99 100 99(L)  CO2 20 - 29 mmol/L _0 Calcium 8.7 - 10.2 mg/dL 10.2 9.8 9.8     Hepatic Function Latest Ref Rng & Units 02/07/2017 06/21/2016  Total Protein 6.0 - 8.5 g/dL 7.2 7.0  Albumin 3.5 - 5.5 g/dL 4.7 4.2  AST 0 - 40 IU/L 32 25  ALT 0 - 32 IU/L 32 28  Alk Phosphatase 39 - 117 IU/L 79 64  Total Bilirubin 0.0 - 1.2 mg/dL 0.4 0.5    CBC Latest Ref Rng & Units 06/17/2016 11/14/2009 11/13/2009  WBC 4.0 - 10.5 K/uL 17.8(H) 8.4 16.6(H)  Hemoglobin 12.0 - 15.0 g/dL 16.2(H) 12.9 15.3(H)  Hematocrit 36.0 - 46.0 %  47.1(H) 37.4 44.3  Platelets 150 - 400 K/uL 217 177 186   Lab Results  Component Value Date   MCV 88.9 06/17/2016   MCV 88.0 11/14/2009   MCV 90.0 11/13/2009   Lab Results  Component Value Date   TSH 1.480 02/07/2017   No results found for: HGBA1C   BNP No results found for: BNP  ProBNP No results found for: PROBNP   Lipid Panel     Component Value Date/Time   CHOL 190 02/07/2017 0818   TRIG 55 02/07/2017 0818   HDL 87 02/07/2017 0818   CHOLHDL 2.2 02/07/2017 0818   CHOLHDL 2.2 06/21/2016 0815   VLDL 12 06/21/2016 0815   LDLCALC 92 02/07/2017 0818   LDLDIRECT 115.1 02/03/2010 0835     RADIOLOGY: No results found.   Additional studies/ records that were reviewed today include:  I reviewed the patient's ER evaluation in detail as well as laboratory. Since her initial visit, I reviewed an echo Doppler study from 11/14/2009 as well as her most recent echo, nuclear stress test, and laboratory.  I reviewed the laboratory from March 28, 2018 done at [facility].   ASSESSMENT:    1. Essential hypertension   2. LBBB (left bundle branch block)   3. Type 1 diabetes mellitus without complication (HCC)   4. Cardiac murmur   5. Hyperlipidemia with target LDL less than 70     PLAN:  Brittney [name] is a 58 year old female who has a history of type 1 diabetes mellitus since age 46  and has been on insulin pump since 1999.  She has a history of hypertension for 18 years and  has a history of hyperlipidemia, currently on pravastatin, as well as a history of hypothyroidism on Synthroid replacement.  When I initially saw her in 2018 she had recently developed  an episode of lightheadedness which seemed to occur after she had inadvertently walked through a nest of bugs.  She noticed itching and pruritus in the possibility of transient associated vasodilation in etiology of her event.  At the time of her evaluation, she was not hypoglycemic and she did not have orthostatic  hypotension.  She was found to have a new left bundle branch block which is new compared to her 2011 ECG.when I initially saw her, she was hypertensive and I recommended initial therapy with amlodipine and started her in a very low dose of 2.5 mg.  When I saw her in follow-up several weeks later amlodipine was titrated to 5 mg for more optimal blood pressure control.  Her echo Doppler study has shown mild mitral valve systolic bowing without definitive prolapse with eccentrically directed mitral regurgitation.  She has documented nonobstructive subvalvular ridge in the LVOT track which has not changed on her most recent echo of 2019 from her prior study.  Her blood pressure today is improved and stable on her current regimen now consisting of amlodipine 5 mg, benazepril HCT 10/12.5 mg.  I reviewed her laboratory.  As a diabetic target LDL is less than 70.  In 2018 her LDL was 92 and most recent laboratory has shown this is risen to 126.  She is only been on pravastatin 20 mg.  I have recommended she discontinue pravastatin and in its place initiate rosuvastatin and I will start this at 10 mg which should be comparable to an 80 mg dose of pravastatin.  In 3 months I have suggested follow-up chemistry and lipid studies.  If her LDL is still above 70 further titration and possible addition of Zetia most likely will be recommended.  She continues to exercise regularly.  She is not having any presyncope or syncope.  She has left bundle branch block which is stable.  I will see her in 6 months for reevaluation.  Time spent: 25 minutes   Medication Adjustments/Labs and Tests Ordered: Current medicines are reviewed at length with the patient today.  Concerns regarding medicines are outlined above.  Medication changes, Labs and Tests ordered today are listed in the Patient Instructions below.  Patient Instructions  Medication Instructions:  Stop Pravastatin  Start Rosuvastatin 10 mg daily. If you need a refill on  your cardiac medications before your next appointment, please call your pharmacy.   Lab work: CMET, LIPID fasting in 3 months. If you have labs (blood work) drawn today and your tests are completely normal, you will receive your results only by: Marland Kitchen MyChart Message (if you have MyChart) OR . A paper copy in the mail If you have any lab test that is abnormal or we need to change your treatment, we will call you to review the results.  Follow-Up: At [facility], you and your health needs are our priority.  As part of our continuing mission to provide you with exceptional heart care, we have created designated Provider Care Teams.  These Care Teams include your primary Cardiologist (physician) and Advanced Practice Providers (APPs -  Physician Assistants and Nurse Practitioners) who all work together to provide you with the care you need, when you need  it. You will need a follow up appointment in 6 months.  Please call our office 2 months in advance to schedule this appointment.  You may see Dr.Kelly or one of the following Advanced Practice Providers on your designated Care Team: Almyra Deforest, [place] . Fabian Sharp, PA-C       Signed, Shelva Majestic, MD, [facility]  04/26/2018 [place] [address] [place], [place], [place]  [address] Phone: [phone]

## 2018-04-26 NOTE — Patient Instructions (Addendum)
Medication Instructions:  Stop Pravastatin  Start Rosuvastatin 10 mg daily. If you need a refill on your cardiac medications before your next appointment, please call your pharmacy.   Lab work: CMET, LIPID fasting in 3 months. If you have labs (blood work) drawn today and your tests are completely normal, you will receive your results only by: Marland Kitchen MyChart Message (if you have MyChart) OR . A paper copy in the mail If you have any lab test that is abnormal or we need to change your treatment, we will call you to review the results.  Follow-Up: At [facility], you and your health needs are our priority.  As part of our continuing mission to provide you with exceptional heart care, we have created designated Provider Care Teams.  These Care Teams include your primary Cardiologist (physician) and Advanced Practice Providers (APPs -  Physician Assistants and Nurse Practitioners) who all work together to provide you with the care you need, when you need it. You will need a follow up appointment in 6 months.  Please call our office 2 months in advance to schedule this appointment.  You may see Dr.Kelly or one of the following Advanced Practice Providers on your designated Care Team: Almyra Deforest, [place] . Fabian Sharp, PA-C

## 2018-05-03 ENCOUNTER — Other Ambulatory Visit: Payer: Self-pay | Admitting: Cardiovascular Disease

## 2018-10-06 LAB — COMPREHENSIVE METABOLIC PANEL
ALT: 24 IU/L (ref 0–32)
AST: 31 IU/L (ref 0–40)
Albumin/Globulin Ratio: 2.2 (ref 1.2–2.2)
Albumin: 4.9 g/dL (ref 3.8–4.9)
Alkaline Phosphatase: 76 IU/L (ref 39–117)
BUN/Creatinine Ratio: 16 (ref 9–23)
BUN: 19 mg/dL (ref 6–24)
Bilirubin Total: 0.4 mg/dL (ref 0.0–1.2)
CO2: 24 mmol/L (ref 20–29)
Calcium: 10.2 mg/dL (ref 8.7–10.2)
Chloride: 97 mmol/L (ref 96–106)
Creatinine, Ser: 1.19 mg/dL — ABNORMAL HIGH (ref 0.57–1.00)
GFR calc Af Amer: 58 mL/min/{1.73_m2} — ABNORMAL LOW (ref >59)
GFR calc non Af Amer: 50 mL/min/{1.73_m2} — ABNORMAL LOW (ref >59)
Globulin, Total: 2.2 g/dL (ref 1.5–4.5)
Glucose: 101 mg/dL — ABNORMAL HIGH (ref 65–99)
Potassium: 4 mmol/L (ref 3.5–5.2)
Sodium: 139 mmol/L (ref 134–144)
Total Protein: 7.1 g/dL (ref 6.0–8.5)

## 2018-10-06 LAB — LIPID PANEL
Chol/HDL Ratio: 2 ratio (ref 0.0–4.4)
Cholesterol, Total: 171 mg/dL (ref 100–199)
HDL: 85 mg/dL (ref >39)
LDL Calculated: 75 mg/dL (ref 0–99)
Triglycerides: 54 mg/dL (ref 0–149)
VLDL Cholesterol Cal: 11 mg/dL (ref 5–40)

## 2019-04-17 ENCOUNTER — Other Ambulatory Visit: Payer: Self-pay | Admitting: Cardiovascular Disease

## 2019-05-12 ENCOUNTER — Other Ambulatory Visit: Payer: Self-pay | Admitting: Cardiovascular Disease

## 2019-05-15 ENCOUNTER — Other Ambulatory Visit: Payer: Self-pay | Admitting: Cardiovascular Disease

## 2019-05-24 ENCOUNTER — Other Ambulatory Visit: Payer: Self-pay | Admitting: Cardiovascular Disease

## 2019-06-03 ENCOUNTER — Other Ambulatory Visit: Payer: Self-pay | Admitting: Cardiovascular Disease

## 2019-06-07 ENCOUNTER — Encounter: Payer: Self-pay | Admitting: Cardiovascular Disease

## 2019-06-07 ENCOUNTER — Telehealth: Payer: Managed Care, Other (non HMO) | Admitting: Cardiovascular Disease

## 2019-06-08 ENCOUNTER — Other Ambulatory Visit: Payer: Self-pay | Admitting: Cardiovascular Disease

## 2019-06-15 ENCOUNTER — Other Ambulatory Visit: Payer: Self-pay | Admitting: Cardiovascular Disease

## 2019-07-18 ENCOUNTER — Other Ambulatory Visit: Payer: Self-pay

## 2019-07-18 MED ORDER — ROSUVASTATIN CALCIUM 10 MG PO TABS: 10.0000 mg | ORAL_TABLET | Freq: Every day | ORAL | 1 refills | Status: DC

## 2019-07-19 ENCOUNTER — Telehealth (INDEPENDENT_AMBULATORY_CARE_PROVIDER_SITE_OTHER): Payer: Managed Care, Other (non HMO) | Admitting: Cardiovascular Disease

## 2019-07-19 ENCOUNTER — Encounter: Payer: Self-pay | Admitting: Cardiovascular Disease

## 2019-07-19 DIAGNOSIS — E785 Hyperlipidemia, unspecified: Secondary | ICD-10-CM | POA: Diagnosis not present

## 2019-07-19 DIAGNOSIS — I10 Essential (primary) hypertension: Secondary | ICD-10-CM

## 2019-07-19 DIAGNOSIS — I447 Left bundle-branch block, unspecified: Secondary | ICD-10-CM

## 2019-07-19 DIAGNOSIS — I340 Nonrheumatic mitral (valve) insufficiency: Secondary | ICD-10-CM

## 2019-07-19 DIAGNOSIS — E7800 Pure hypercholesterolemia, unspecified: Secondary | ICD-10-CM

## 2019-07-19 DIAGNOSIS — E109 Type 1 diabetes mellitus without complications: Secondary | ICD-10-CM | POA: Diagnosis not present

## 2019-07-19 NOTE — Patient Instructions (Signed)
Medication Instructions:  CONTINUE WITH CURRENT MEDICATIONS. NO CHANGES.  *If you need a refill on your cardiac medications before your next appointment, please call your pharmacy*  Follow-Up: At [facility], you and your health needs are our priority.  As part of our continuing mission to provide you with exceptional heart care, we have created designated Provider Care Teams.  These Care Teams include your primary Cardiologist (physician) and Advanced Practice Providers (APPs -  Physician Assistants and Nurse Practitioners) who all work together to provide you with the care you need, when you need it.  We recommend signing up for the patient portal called "MyChart".  Sign up information is provided on this After Visit Summary.  MyChart is used to connect with patients for Virtual Visits (Telemedicine).  Patients are able to view lab/test results, encounter notes, upcoming appointments, etc.  Non-urgent messages can be sent to your provider as well.   To learn more about what you can do with MyChart, go to [email or web address].    Your next appointment:  IN MARCH 10 month(s)  The format for your next appointment:   In Person  Provider:   Shelva Majestic, MD

## 2019-07-19 NOTE — Progress Notes (Signed)
Virtual Visit via Video Note   This visit type was conducted due to national recommendations for restrictions regarding the COVID-19 Pandemic (e.g. social distancing) in an effort to limit this patient's exposure and mitigate transmission in our community.  Due to her co-morbid illnesses, this patient is at least at moderate risk for complications without adequate follow up.  This format is felt to be most appropriate for this patient at this time.  All issues noted in this document were discussed and addressed.  A limited physical exam was performed with this format.  Please refer to the patient's chart for her consent to telehealth for [facility].   The patient was identified using 2 identifiers.  Date:  07/19/2019   ID:  Brittney [name], DOB Feb 23, 1961, MRN [id number]  Patient Location: Other:  work Provider Location: Home  PCP:  System, Pcp Not In  Cardiologist:  Shelva Majestic, MD Electrophysiologist:  None   Evaluation Performed:  Follow-Up Visit  Chief Complaint:  14 month F/U  History of Present Illness:    Brittney [name] is a 59 y.o. female was initially referred by Dr. Davonna Belling following emergency room evaluation for presyncope where she was found to have left bundle branch block.  I saw her for initial evaluation on 06/28/2016 and last saw her in March 2020.  She presents for follow-up evaluation.  Brittney [name] has a history of type 1 diabetes mellitus since age 81.  She is followed very closely by Dr. Joneen Roach in endocrinology at [facility].  In 1999, she was started on an insulin pump. She has a history of hypertension and was started on medication approximately 16 years ago.  She has been taking, benazapril/HCT 10/12.5 twice a day.  She has a history of hyperlipidemia and 7 years ago was started on pravastatin 20 mg daily.  She has a history of hypothyroidism for which she has been taking Synthroid 112 g daily.  She denies any awareness of chest pain, PND, orthopnea.  She is  unaware of palpitations.  On 06/17/2016 she developed a near syncopal episode while at work.  Apparently, she had just walked through a patch of bugs and then started to itch all over.  She suddenly became lightheaded, but did not lose consciousness or fall.  Due to persistent symptoms, EMS was called.  Her CBG was 90.  She has an insulin pump and had eaten breakfast, but had not had lunch.  She had a similar episode in 2011 which was attributed to a vasovagal episode.  In the ER her blood pressure was 131/61.  Her pulse was 60.  She was not anemic, but white count was 17.8.  A bmet was normal with a creatinine of 1.24, glucose 148.  Her ECG showed sinus rhythm with a new left bundle branch block from her 2011 ECG.  In the emergency room, she had a negative workup.  A chest x-ray was negative.  Troponin was negative.  Because of her new left bundle branch block, she is now referred for cardiology consultation and evaluation.  Upon arrival to the emergency room, she denied fevers, chills, headache, visual changes, cough, shortness of breath, chest pain, palpitations, abdominal pain, nausea, or vomiting.  She denied any paresthesias.  While in the emergency room, her blood pressure was 131/61.  Her pulse was 60.  She had negative orthostatics.  Her CBG was 178.  Laboratory revealed an elevated white blood count at 17.8 with a normal hemoglobin and hematocrit.  Cr was 1.24 with an anion gap of 17, which was felt possibly due to mild dehydration.  Her chest x-ray was negative.  Troponin was negative.  Her ECG showed sinus rhythm but there was a new left bundle branch block.  Compared to her prior ECG of 2011.  Because of this new left bundle branch block.  She was  referred for cardiology consultation and evaluation.  When I saw her initially, she was hypertensive and recommended initiation of low-dose amlodipine at 2.5 mg daily for more optimal blood pressure control.  She had a 2/6 systolic murmur left sternal  border and is scheduled her for an echo Doppler study.  This demonstrated normal LV function with mild grade 1 diastolic dysfunction.  The left ventricle outflow tract below the aortic valve.  There was a subvalvular calcified ridge which created turbulence resulting in a mean gradient of 11.  There was no valvular aortic stenosis.  She underwent repeat laboratory which revealed a total cholesterol 181, triglycerides 58, HDL 83, and LDL 86.  With her long-standing diabetes mellitus, history I referred her for nuclear stress test.  This revealed normal perfusion with an ejection fraction of 58% without evidence for scar or ischemia.   Since I last saw her, she was seen in January 2019 by Jory Sims, NP and remained fairly stable.  She underwent a follow-up echo Doppler study in July 2019 which continue to show normal systolic function with an EF of 55 to 60%.  There was systolic bowing of her mitral valve without prolapse with mild MR directed eccentrically and posteriorly.  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  She is monitored by Dr. Joneen Roach at [facility] at six-month intervals for her type 1 diabetes mellitus.  She brought with her complete set of laboratory from February 05/2018.  Of note, her lipids had increased and her total cholesterol was 227 with an LDL cholesterol of 126, HDL cholesterol at 87 and triglycerides at 70.  Laboratory was otherwise normal except there was very mild urine microalbumin.  She continues to be on pravastatin at 20 mg amlodipine 5 mg, benazepril/HCTZ 10/12.5 mg in addition to her levothyroxine 112 mcg and Humalog insulin via pump.  She denies chest pain PND orthopnea.  She denies palpitations.  She denies presyncope or syncope.    Since my last evaluation, she states that she has remained stable.  She continues to be evaluated at [facility] regularly.   She denies any chest pain.  She is unaware of palpitations.  She denies any presyncopal events.  She underwent laboratory on 05/29/2019 with an NMR LipoProfile at [facility] showed a cholesterol of 173.  LDL size was 20.9 with small LDL particles at 313 and total LDL particle #779.  She had stable renal function with a BUN of 24 creatinine 1.3.  Her blood pressure has been stable.  She presents for video telemedicine evaluation.  The patient does not have symptoms concerning for COVID-19 infection (fever, chills, cough, or new shortness of breath).    Past Medical History:  Diagnosis Date  . Diabetes mellitus    has insulin pump  . Endocervical polyp   . Hyperlipidemia   . Hypertension   . Hypothyroidism   . Increased blood pressure (not hypertension)   . Oligomenorrhea   . Thyroid disease   . Type 1 diabetes mellitus ([place])   . Uterine enlargement   .  Uterine fibroid    Past Surgical History:  Procedure Laterality Date  . CESAREAN SECTION    . MYOMECTOMY ABDOMINAL APPROACH       Current Meds  Medication Sig  . amLODipine (NORVASC) 5 MG tablet Take 1 tablet (5 mg total) by mouth daily.  Marland Kitchen aspirin 81 MG tablet Take 81 mg by mouth daily.    . benazepril-hydrochlorthiazide (LOTENSIN HCT) 10-12.5 MG per tablet Take 1 tablet by mouth Twice daily.  Marland Kitchen HUMALOG 100 UNIT/ML injection Inject into the skin Continuous. Via pump  . Multiple Vitamins-Minerals (MULTIVITAMIN WITH MINERALS) tablet Take 1 tablet by mouth daily.    . rosuvastatin (CRESTOR) 10 MG tablet Take 1 tablet (10 mg total) by mouth daily.  Marland Kitchen SYNTHROID 112 MCG tablet Take 1 tablet by mouth Daily.     Allergies:   Patient has no known allergies.   Social History   Tobacco Use  . Smoking status: Never Smoker  . Smokeless tobacco: Never Used  Substance Use Topics  . Alcohol use: No  . Drug use: No     Family Hx: The patient's family history includes Diabetes in her father, maternal grandfather, paternal grandfather, and  sister; Hypertension in her father and sister; Hypothyroidism in her sister.  ROS:   Please see the history of present illness.    No fevers chills night sweats Exercising regularly, she had been unable to go to the gym due to the Covid.  She had recently purchased a treadmill to exercise at home. No palpitations or chest pain Her diabetes has been controlled and continues to be on an insulin pump No abdominal pain She denies significant swelling No neurologic symptoms Sleeping well All other systems reviewed and are negative.   Prior CV studies:   The following studies were reviewed today:  ECHO 09/06/2017 Study Conclusions   - Left ventricle: The cavity size was normal. Systolic function was  normal. The estimated ejection fraction was in the range of 55%  to 60%. Wall motion was normal; there were no regional wall  motion abnormalities. Left ventricular diastolic function  parameters were normal.  - Mitral valve: Systolic bowing without prolapse. There was mild  regurgitation directed eccentrically and posteriorly.   Impressions:   - There is a nonobstructive subvalvular ridge in the left  ventricular outflow tract, not meaningfully changed from the  previous study.   Labs/Other Tests and Data Reviewed:    EKG:  An ECG dated 04/26/2018 was personally reviewed today and demonstrated:   Normal sinus rhythm at 81 bpm.  Left bundle branch block with repolarization changes.  QTc interval 492 ms.  Jun 28 2016 ECG (independently read by me):  Normal sinus rhythm at 77 bpm.  Left bundle branch block with repolarization changes.  PR interval 148 ms; QTc interval 454 ms.   Recent Labs: 10/06/2018: ALT 24; BUN 19; Creatinine, Ser 1.19; Potassium 4.0; Sodium 139   Recent Lipid Panel Lab Results  Component Value Date/Time   CHOL 171 10/06/2018 08:25 AM   TRIG 54 10/06/2018 08:25 AM   HDL 85 10/06/2018 08:25 AM   CHOLHDL 2.0 10/06/2018 08:25 AM   CHOLHDL 2.2  06/21/2016 08:15 AM   LDLCALC 75 10/06/2018 08:25 AM   LDLDIRECT 115.1 02/03/2010 08:35 AM    Wt Readings from Last 3 Encounters:  07/19/19 135 lb (61.2 kg)  06/07/19 136 lb (61.7 kg)  04/26/18 135 lb 12.8 oz (61.6 kg)     Objective:    Vital Signs:  BP (!) 126/59   Pulse 62   Ht 5' 3.5" (1.613 m)   Wt 135 lb (61.2 kg)   BMI 23.54 kg/m    This was a video telemedicine visit. Visually, appearance was unchanged Breathing was normal and not labored There is no audible wheezing She felt her heart rhythm to be stable without palpitations No awareness of any chest wall or abdominal discomfort No swelling No rash No recurrent episodes of lightheadedness Normal affect and mood  ASSESSMENT & PLAN:    1. Essential Hypertension: Blood pressure continues to be controlled on benazepril/HCT 10/12.5 mg daily.  2. Mitral regurgitation: Her echo Doppler study from 2019 was again reviewed which showed normal systolic function with systolic bowing of her mitral valve without definitive prolapse with mild MR directed eccentrically.  She again was noted to have a nonobstructive subvalvular ridge in the LVOT tract which was not change from prior evaluation. 3. Hyperlipidemia: In the past I recommended discontinuance of pravastatin and changed her to rosuvastatin 20 mg.  She is tolerating this well without myalgias. I reviewed her lipid profile laboratory obtained at [facility]  LDL particle number is low at 313.  Total cholesterol 173, triglycerides 66. 4. Type 1 diabetes mellitus: Diagnosed at age 16.  Followed by Dr. Joya Salm at [facility]  and continues to be on insulin pump.  Recently evaluated for microalbuminuria. 5. Left bundle branch block: No ECG was able to be obtained today. 6. Hypothyroidism: On levothyroxine  COVID-19 Education: The signs and symptoms of COVID-19 were discussed with the patient and how to seek care for testing (follow up with PCP or arrange E-visit).  The importance of social  distancing was discussed today.  Time:   Today, I have spent 18 minutes with the patient with telehealth technology discussing the above problems and additional 15 minutes in charting.     Medication Adjustments/Labs and Tests Ordered: Current medicines are reviewed at length with the patient today.  Concerns regarding medicines are outlined above.   Tests Ordered: No orders of the defined types were placed in this encounter.   Medication Changes: No orders of the defined types were placed in this encounter.   Follow Up: In the office visit March 2022  Signed, Shelva Majestic, MD  07/19/2019 8:36 AM    [place]

## 2019-07-21 ENCOUNTER — Encounter: Payer: Self-pay | Admitting: Cardiovascular Disease

## 2019-08-11 ENCOUNTER — Other Ambulatory Visit: Payer: Self-pay | Admitting: Cardiovascular Disease

## 2019-10-28 ENCOUNTER — Telehealth: Payer: Self-pay | Admitting: Physician Assistant

## 2019-10-28 NOTE — Telephone Encounter (Signed)
   Patient has been called from Urgent Care on [address]  She had a near syncopal episode while driving.  She did not completely lose consciousness.  However, they were concerned and he brought her to the Urgent Care.  They did not have an old ECG and so he contacted [place].  Briefly reviewed the ECG verbally with the provider.  Advised them that she has a history of a left bundle with a QRS duration 134 ms.  The ECG was faxed over and reviewed by Dr. Harrell Gave.  There are no acute ischemic changes.  I will route this message to staff in order to get her a follow-up appointment with Dr. Claiborne Billings or other provider.  Rosaria Ferries, PA-C 10/28/2019 4:29 PM

## 2019-10-30 ENCOUNTER — Telehealth: Payer: Self-pay | Admitting: Cardiovascular Disease

## 2019-10-30 NOTE — Telephone Encounter (Signed)
10/21/19 In [place] [employer] at a game outside standing up talking for about an hour and then walked up 30 steps, became lightheaded and passed out.  Went to ER ([place]) on 8/29 - did testing showed no cardiac event.   Michela Pitcher it was decided "Climbing the stairs was more than heart cold take".  9/5 driving down the road and became lightheaded.  Nothing like when she passed out the week before but still felt bad - went to [facility] - labs, cxr, ekg done and rec follow up in cardiology.  On call cardiology consulted w Urgent Care during that visit.    I have moved her appointment up to 11/01/19 at the [facility] office.  Message sent to chart prep for any available ER records from [facility].

## 2019-10-30 NOTE — Telephone Encounter (Signed)
Pt c/o Syncope: STAT if syncope occurred within 30 minutes and pt complains of lightheadedness High Priority if episode of passing out, completely, today or in last 24 hours   1. Did you pass out today? No about a week ago- said it was due to being hot   2. When is the last time you passed out? 10-22-19- this Sunday had a wave of lightheadedness   3. Has this occurred multiple times?yes   4. Did you have any symptoms prior to passing out? No- now she feels lightheaded, no energy, mild dirrhea and no appetite- pt would like to be seen asap- I made her an appt for Friday

## 2019-10-31 ENCOUNTER — Telehealth: Payer: Self-pay | Admitting: Physician Assistant

## 2019-10-31 NOTE — Telephone Encounter (Signed)
Please call pt to schedule an appointment with Dr. Claiborne Billings or APP. Thank you!

## 2019-10-31 NOTE — Progress Notes (Signed)
Cardiology Office Note    Date:  11/01/2019   ID:  Brittney [name], DOB Oct 14, 1960, MRN [id number]  PCP:  Troy Sine, MD  Cardiologist:  Dr. Claiborne Billings   Chief Complaint: syncope  History of Present Illness:   Brittney [name] is a 59 y.o. female with hx of type 1 DM, HTN, prior hx of presyncope, HLD, chronic LBBB, hypothyroidism and mitral regurgitation seen for pre syncope.   Initially seen by Dr. Aundra Dubin In 2011 for syncope x2. Syncope after a large bowel movement and again shortly afterwards with emesis.  She had 2 syncopal episodes prior to these in the setting of blood draws.  Echo with normal LV systolic function and mild MR. Normal holter monitor. Normal exercise stress test. Felt vagal mediated.   Established care with Dr. Claiborne Billings 05/2016 for presyncope insetting of new LBBB. Echo showed normal LV function with mild grade 1 diastolic dysfunction.  The left ventricle outflow tract below the aortic valve.  There was a subvalvular calcified ridge which created turbulence resulting in a mean gradient of 11.  There was no valvular aortic stenosis.  Last echo 08/2017 showed LVEF of 55-60%, no WM abnormality. There is a nonobstructive subvalvular ridge in the left  ventricular outflow tract, not meaningfully changed from the previous study.   Patient had an episode of syncope on August 29.  She was outside standing for at least 1 hour in 97 degree.  She was climbing stair and suddenly felt lightheaded and passed out for 1 minute.  Evaluated by paramedics on site.  Her blood pressure noted to be 70/40.  She was taken to ER where work-up was reassuring and diagnosed with "heat exhaustion".  She was discharged from home.  Did not felt well next day.  She again had episode of dizziness while driving.  Husband was in the next seat.  No prodromal symptoms.  She went to urgent care (notes in epic).  Work-up was reassuring except low TSH and discharge.  She discussed TSH level with her endocrinologist who  recommended continue to monitor symptoms.  Plan for repeat labs in future.  No recurrent episode of dizziness or syncope since then.  Patient is very active at baseline.  She does elliptical training 30 minutes 4 days/week and other 2 days does weight training for 30 minutes.  She also walks 45 to 60 minutes at a time.  During these extraneous activity she has no symptoms.  She denies chest pain, shortness of breath, orthopnea, PND, lower extremity edema or melena.  Past Medical History:  Diagnosis Date  . Diabetes mellitus    has insulin pump  . Endocervical polyp   . Hyperlipidemia   . Hypertension   . Hypothyroidism   . Increased blood pressure (not hypertension)   . Oligomenorrhea   . Thyroid disease   . Type 1 diabetes mellitus ([place])   . Uterine enlargement   . Uterine fibroid     Past Surgical History:  Procedure Laterality Date  . CESAREAN SECTION    . MYOMECTOMY ABDOMINAL APPROACH      Current Medications: Prior to Admission medications   Medication Sig Start Date End Date Taking? Authorizing Provider  amLODipine (NORVASC) 5 MG tablet Take 1 tablet (5 mg total) by mouth daily. 06/08/19   Troy Sine, MD  aspirin 81 MG tablet Take 81 mg by mouth daily.      [provider]  benazepril-hydrochlorthiazide (LOTENSIN HCT) 10-12.5 MG per tablet Take 1 tablet  by mouth Twice daily. 06/29/10   [provider]  calcium carbonate (OS-CAL) 600 MG TABS Take 600 mg by mouth daily.      [provider]  HUMALOG 100 UNIT/ML injection Inject into the skin Continuous. Via pump 07/22/10   [provider]  Multiple Vitamins-Minerals (MULTIVITAMIN WITH MINERALS) tablet Take 1 tablet by mouth daily.      [provider]  rosuvastatin (CRESTOR) 10 MG tablet TAKE 1 TABLET BY MOUTH EVERY DAY 08/13/19   Troy Sine, MD  SYNTHROID 112 MCG tablet Take 1 tablet by mouth Daily. 06/29/10   [provider]    Allergies:   Patient has no known  allergies.   Social History   Socioeconomic History  . Marital status: Married    Spouse name: Not on file  . Number of children: Not on file  . Years of education: Not on file  . Highest education level: Not on file  Occupational History  . Not on file  Tobacco Use  . Smoking status: Never Smoker  . Smokeless tobacco: Never Used  Substance and Sexual Activity  . Alcohol use: No  . Drug use: No  . Sexual activity: Yes    Birth control/protection: Post-menopausal  Other Topics Concern  . Not on file  Social History Narrative  . Not on file   Social Determinants of Health   Financial Resource Strain:   . Difficulty of Paying Living Expenses: Not on file  Food Insecurity:   . Worried About Charity fundraiser in the Last Year: Not on file  . Ran Out of Food in the Last Year: Not on file  Transportation Needs:   . Lack of Transportation (Medical): Not on file  . Lack of Transportation (Non-Medical): Not on file  Physical Activity:   . Days of Exercise per Week: Not on file  . Minutes of Exercise per Session: Not on file  Stress:   . Feeling of Stress : Not on file  Social Connections:   . Frequency of Communication with Friends and Family: Not on file  . Frequency of Social Gatherings with Friends and Family: Not on file  . Attends Religious Services: Not on file  . Active Member of Clubs or Organizations: Not on file  . Attends Archivist Meetings: Not on file  . Marital Status: Not on file     Family History:  The patient'Brittney family history includes Diabetes in her father, maternal grandfather, paternal grandfather, and sister; Hypertension in her father and sister; Hypothyroidism in her sister.   ROS:   Please see the history of present illness.    ROS All other systems reviewed and are negative.   PHYSICAL EXAM:   VS:  BP (!) 146/80   Pulse 80   Wt 134 lb 3.2 oz (60.9 kg)   SpO2 99%   BMI 23.40 kg/m    GEN: Well nourished, well developed, in no  acute distress  HEENT: normal  Neck: no JVD, carotid bruits, or masses Cardiac: RRR;+  murmurs, rubs, or gallops,no edema  Respiratory:  clear to auscultation bilaterally, normal work of breathing GI: soft, nontender, nondistended, + BS MS: no deformity or atrophy  Skin: warm and dry, no rash Neuro:  Alert and Oriented x 3, Strength and sensation are intact Psych: euthymic mood, full affect  Wt Readings from Last 3 Encounters:  11/01/19 134 lb 3.2 oz (60.9 kg)  07/19/19 135 lb (61.2 kg)  06/07/19 136 lb (61.7  kg)      Studies/Labs Reviewed:   EKG:  EKG is ordered today.  The ekg ordered today demonstrates normal sinus rhythm with chronic right bundle branch block  Recent Labs: No results found for requested labs within last 8760 hours.   Lipid Panel    Component Value Date/Time   CHOL 171 10/06/2018 0825   TRIG 54 10/06/2018 0825   HDL 85 10/06/2018 0825   CHOLHDL 2.0 10/06/2018 0825   CHOLHDL 2.2 06/21/2016 0815   VLDL 12 06/21/2016 0815   LDLCALC 75 10/06/2018 0825   LDLDIRECT 115.1 02/03/2010 0835    Additional studies/ records that were reviewed today include:   Echocardiogram: 08/2017 Study Conclusions   - Left ventricle: The cavity size was normal. Systolic function was  normal. The estimated ejection fraction was in the range of 55%  to 60%. Wall motion was normal; there were no regional wall  motion abnormalities. Left ventricular diastolic function  parameters were normal.  - Mitral valve: Systolic bowing without prolapse. There was mild  regurgitation directed eccentrically and posteriorly.   Impressions:   - There is a nonobstructive subvalvular ridge in the left  ventricular outflow tract, not meaningfully changed from the  previous study.    ASSESSMENT & PLAN:    1. Syncope/dizziness -First episode could be due to heat exhustion as summarized above.  However second episode occurred while driving and witnessed by her husband who  was in the passenger seat.  Etiology could be valvular disease, arrhythmia or thyroid.  TSH has been managed by endocrinologist.  She exercise vigorously without any anginal symptoms. Prior syncope felt due to vasovagal event. -We will get echocardiogram, carotid Doppler and 30-days event monitor to complete evaluation. -No driving for 6 months  -She works as a Lobbyist in school.  Will give letter to work from home if needed.  2.  Mitral regurgitation -Mild murmur heard -Pending echocardiogram  3.  Hypertension -No change in therapy  4.  Thyroid disease/diabetes mellitus -Managed by endocrinologist at [facility].    Medication Adjustments/Labs and Tests Ordered: Current medicines are reviewed at length with the patient today.  Concerns regarding medicines are outlined above.  Medication changes, Labs and Tests ordered today are listed in the Patient Instructions below. Patient Instructions  Medication Instructions:  Your physician recommends that you continue on your current medications as directed. Please refer to the Current Medication list given to you today.  *If you need a refill on your cardiac medications before your next appointment, please call your pharmacy*   Lab Work: None ordered  If you have labs (blood work) drawn today and your tests are completely normal, you will receive your results only by: Marland Kitchen MyChart Message (if you have MyChart) OR . A paper copy in the mail If you have any lab test that is abnormal or we need to change your treatment, we will call you to review the results.   Testing/Procedures: Your physician has requested that you have an echocardiogram. Echocardiography is a painless test that uses sound waves to create images of your heart. It provides your doctor with information about the size and shape of your heart and how well your heart'Brittney chambers and valves are working. This procedure takes approximately one hour. There are no restrictions for this  procedure.  Your physician has requested that you have a carotid duplex. This test is an ultrasound of the carotid arteries in your neck. It looks at blood flow through these arteries that supply  the brain with blood. Allow one hour for this exam. There are no restrictions or special instructions.  Your physician has recommended that you wear an event monitor.  Event monitors are medical devices that record the heart'Brittney electrical activity. Doctors most often [place] these monitors to diagnose arrhythmias. Arrhythmias are problems with the speed or rhythm of the heartbeat. The monitor is a small, portable device. You can wear one while you do your normal daily activities. This is usually used to diagnose what is causing palpitations/syncope (passing out).  SEE INSTRUCTIONS BELOW:  [employer] Cardiac Event Monitor Instructions Your physician has requested you wear your cardiac event monitor for 30 days, (1-30). [employer] may call or text to confirm a shipping address. The monitor will be sent to a land address via UPS. [employer] will not ship a monitor to a PO BOX. It typically takes 3-5 days to receive your monitor after it has been enrolled. [employer] will assist with USPS tracking if your package is delayed. The telephone number for [employer] is [phone]. Once you have received your monitor, please review the enclosed instructions. Instruction tutorials can also be viewed under help and settings on the enclosed cell phone. Your monitor has already been registered assigning a specific monitor serial # to you.  Applying the monitor Remove cell phone from case and turn it on. The cell phone works as Dealer and needs to be within [employer] of you at all times. The cell phone will need to be charged on a daily basis. We recommend you plug the cell phone into the enclosed charger at your bedside table every night.  Monitor batteries: You will receive two monitor batteries labelled #1 and  #2. These are your recorders. Plug battery #2 onto the second connection on the enclosed charger. Keep one battery on the charger at all times. This will keep the monitor battery deactivated. It will also keep it fully charged for when you need to switch your monitor batteries. A small light will be blinking on the battery emblem when it is charging. The light on the battery emblem will remain on when the battery is fully charged.  Open package of a Monitor strip. Insert battery #1 into black hood on strip and gently squeeze monitor battery onto connection as indicated in instruction booklet. Set aside while preparing skin.  Choose location for your strip, vertical or horizontal, as indicated in the instruction booklet. Shave to remove all hair from location. There cannot be any lotions, oils, powders, or colognes on skin where monitor is to be applied. Wipe skin clean with enclosed Saline wipe. Dry skin completely.  Peel paper labeled #1 off the back of the Monitor strip exposing the adhesive. Place the monitor on the chest in the vertical or horizontal position shown in the instruction booklet. One arrow on the monitor strip must be pointing upward. Carefully remove paper labeled #2, attaching remainder of strip to your skin. Try not to create any folds or wrinkles in the strip as you apply it.  Firmly press and release the circle in the center of the monitor battery. You will hear a small beep. This is turning the monitor battery on. The heart emblem on the monitor battery will light up every 5 seconds if the monitor battery in turned on and connected to the patient securely. Do not push and hold the circle down as this turns the monitor battery off. The cell phone will locate the monitor battery. A screen will appear on the  cell phone checking the connection of your monitor strip. This may read poor connection initially but change to good connection within the next minute. Once your  monitor accepts the connection you will hear a series of 3 beeps followed by a climbing crescendo of beeps. A screen will appear on the cell phone showing the two monitor strip placement options. Touch the picture that demonstrates where you applied the monitor strip.  Your monitor strip and battery are waterproof. You are able to shower, bathe, or swim with the monitor on. They just ask you do not submerge deeper than 3 feet underwater. We recommend removing the monitor if you are swimming in a lake, river, or ocean.  Your monitor battery will need to be switched to a fully charged monitor battery approximately once a week. The cell phone will alert you of an action which needs to be made.  On the cell phone, tap for details to reveal connection status, monitor battery status, and cell phone battery status. The green dots indicates your monitor is in good status. A red dot indicates there is something that needs your attention.  To record a symptom, click the circle on the monitor battery. In 30-60 seconds a list of symptoms will appear on the cell phone. Select your symptom and tap save. Your monitor will record a sustained or significant arrhythmia regardless of you clicking the button. Some patients do not feel the heart rhythm irregularities. [employer] will notify us of any serious or critical events.  Refer to instruction booklet for instructions on switching batteries, changing strips, the Do not disturb or Pause features, or any additional questions.  Call [employer] at [phone], to confirm your monitor is transmitting and record your baseline. They will answer any questions you may have regarding the monitor instructions at that time.  Returning the monitor to [place] all equipment back into blue box. Peel off strip of paper to expose adhesive and close box securely. There is a prepaid UPS shipping label on this box. Drop in a UPS drop box, or at a UPS facility  like [employer]. You may also contact [employer] to arrange UPS to pick up monitor package at your home.     Follow-Up: At [facility], you and your health needs are our priority.  As part of our continuing mission to provide you with exceptional heart care, we have created designated Provider Care Teams.  These Care Teams include your primary Cardiologist (physician) and Advanced Practice Providers (APPs -  Physician Assistants and Nurse Practitioners) who all work together to provide you with the care you need, when you need it.  We recommend signing up for the patient portal called "MyChart".  Sign up information is provided on this After Visit Summary.  MyChart is used to connect with patients for Virtual Visits (Telemedicine).  Patients are able to view lab/test results, encounter notes, upcoming appointments, etc.  Non-urgent messages can be sent to your provider as well.   To learn more about what you can do with MyChart, go to [email or web address].    Your next appointment:   2 MONTHS  The format for your next appointment:   In Person  Provider:   You may see No primary care provider on file. or one of the following Advanced Practice Providers on your designated Care Team:    Almyra Deforest, PA-C  Fabian Sharp, [place] or   Roby Lofts, [place]    Other Instructions  Echocardiogram An echocardiogram is a procedure that uses  painless sound waves (ultrasound) to produce an image of the heart. Images from an echocardiogram can provide important information about:  Signs of coronary artery disease (CAD).  Aneurysm detection. An aneurysm is a weak or damaged part of an artery wall that bulges out from the normal force of blood pumping through the body.  Heart size and shape. Changes in the size or shape of the heart can be associated with certain conditions, including heart failure, aneurysm, and CAD.  Heart muscle function.  Heart valve function.  Signs of a past heart  attack.  Fluid buildup around the heart.  Thickening of the heart muscle.  A tumor or infectious growth around the heart valves. Tell a health care provider about:  Any allergies you have.  All medicines you are taking, including vitamins, herbs, eye drops, creams, and over-the-counter medicines.  Any blood disorders you have.  Any surgeries you have had.  Any medical conditions you have.  Whether you are pregnant or may be pregnant. What are the risks? Generally, this is a safe procedure. However, problems may occur, including:  Allergic reaction to dye (contrast) that may be used during the procedure. What happens before the procedure? No specific preparation is needed. You may eat and drink normally. What happens during the procedure?   An IV tube may be inserted into one of your veins.  You may receive contrast through this tube. A contrast is an injection that improves the quality of the pictures from your heart.  A gel will be applied to your chest.  A wand-like tool (transducer) will be moved over your chest. The gel will help to transmit the sound waves from the transducer.  The sound waves will harmlessly bounce off of your heart to allow the heart images to be captured in real-time motion. The images will be recorded on a computer. The procedure may vary among health care providers and hospitals. What happens after the procedure?  You may return to your normal, everyday life, including diet, activities, and medicines, unless your health care provider tells you not to do that. Summary  An echocardiogram is a procedure that uses painless sound waves (ultrasound) to produce an image of the heart.  Images from an echocardiogram can provide important information about the size and shape of your heart, heart muscle function, heart valve function, and fluid buildup around your heart.  You do not need to do anything to prepare before this procedure. You may eat and  drink normally.  After the echocardiogram is completed, you may return to your normal, everyday life, unless your health care provider tells you not to do that. This information is not intended to replace advice given to you by your health care provider. Make sure you discuss any questions you have with your health care provider. Document Revised: 06/01/2018 Document Reviewed: 03/13/2016 Elsevier Patient Education  [place], [place], [place]  11/01/2019 9:08 AM    [facility] [address], [place], [place]  [address] Phone: [phone]; Fax: [phone]

## 2019-10-31 NOTE — Telephone Encounter (Signed)
Agree with plan evaluation to see APP.

## 2019-10-31 NOTE — Telephone Encounter (Signed)
New message:     Patient husband calling stating that her BP 140/70.  Now 137/87 HR 87/Patient would like to know if she need to go to the ER. Please call patient back. Lightheaded

## 2019-10-31 NOTE — Telephone Encounter (Signed)
Spoke to pt and a week ago while at soccer game passed out and was evaluated and dx was dehydration Pt calling today with complaints of lethargic and loss of appetite and this started this Sunday  Pt has checked glucose and is running in the 150's B/P and HR  Running average for  Pt see readings below Pt had covid antigen tested and was negative Pt was seen at [facility] is low Pt has appt with Robbie Lis PA tom Will forward to Dr Claiborne Billings for review and recommendations Also pt has call out to Endo as well ./cy

## 2019-11-01 ENCOUNTER — Encounter: Payer: Self-pay | Admitting: Physician Assistant

## 2019-11-01 ENCOUNTER — Other Ambulatory Visit: Payer: Self-pay

## 2019-11-01 ENCOUNTER — Telehealth: Payer: Self-pay | Admitting: Radiology

## 2019-11-01 ENCOUNTER — Ambulatory Visit: Payer: Managed Care, Other (non HMO) | Admitting: Physician Assistant

## 2019-11-01 VITALS — BP 146/80 | HR 80 | Wt 134.2 lb

## 2019-11-01 DIAGNOSIS — I10 Essential (primary) hypertension: Secondary | ICD-10-CM | POA: Diagnosis not present

## 2019-11-01 DIAGNOSIS — R42 Dizziness and giddiness: Secondary | ICD-10-CM

## 2019-11-01 DIAGNOSIS — I447 Left bundle-branch block, unspecified: Secondary | ICD-10-CM

## 2019-11-01 DIAGNOSIS — R55 Syncope and collapse: Secondary | ICD-10-CM

## 2019-11-01 DIAGNOSIS — I340 Nonrheumatic mitral (valve) insufficiency: Secondary | ICD-10-CM

## 2019-11-01 NOTE — Telephone Encounter (Signed)
Enrolled patient for a 30 day [employer] Event Monitor to be mailed to patients home  

## 2019-11-01 NOTE — Patient Instructions (Addendum)
Medication Instructions:  Your physician recommends that you continue on your current medications as directed. Please refer to the Current Medication list given to you today.  *If you need a refill on your cardiac medications before your next appointment, please call your pharmacy*   Lab Work: None ordered  If you have labs (blood work) drawn today and your tests are completely normal, you will receive your results only by: Marland Kitchen MyChart Message (if you have MyChart) OR . A paper copy in the mail If you have any lab test that is abnormal or we need to change your treatment, we will call you to review the results.   Testing/Procedures: Your physician has requested that you have an echocardiogram. Echocardiography is a painless test that uses sound waves to create images of your heart. It provides your doctor with information about the size and shape of your heart and how well your heart's chambers and valves are working. This procedure takes approximately one hour. There are no restrictions for this procedure.  Your physician has requested that you have a carotid duplex. This test is an ultrasound of the carotid arteries in your neck. It looks at blood flow through these arteries that supply the brain with blood. Allow one hour for this exam. There are no restrictions or special instructions.  Your physician has recommended that you wear an event monitor.  Event monitors are medical devices that record the heart's electrical activity. Doctors most often [place] these monitors to diagnose arrhythmias. Arrhythmias are problems with the speed or rhythm of the heartbeat. The monitor is a small, portable device. You can wear one while you do your normal daily activities. This is usually used to diagnose what is causing palpitations/syncope (passing out).  SEE INSTRUCTIONS BELOW:  [employer] Cardiac Event Monitor Instructions Your physician has requested you wear your cardiac event monitor for 30 days,  (1-30). [employer] may call or text to confirm a shipping address. The monitor will be sent to a land address via UPS. [employer] will not ship a monitor to a PO BOX. It typically takes 3-5 days to receive your monitor after it has been enrolled. [employer] will assist with USPS tracking if your package is delayed. The telephone number for [employer] is [phone]. Once you have received your monitor, please review the enclosed instructions. Instruction tutorials can also be viewed under help and settings on the enclosed cell phone. Your monitor has already been registered assigning a specific monitor serial # to you.  Applying the monitor Remove cell phone from case and turn it on. The cell phone works as Dealer and needs to be within [employer] of you at all times. The cell phone will need to be charged on a daily basis. We recommend you plug the cell phone into the enclosed charger at your bedside table every night.  Monitor batteries: You will receive two monitor batteries labelled #1 and #2. These are your recorders. Plug battery #2 onto the second connection on the enclosed charger. Keep one battery on the charger at all times. This will keep the monitor battery deactivated. It will also keep it fully charged for when you need to switch your monitor batteries. A small light will be blinking on the battery emblem when it is charging. The light on the battery emblem will remain on when the battery is fully charged.  Open package of a Monitor strip. Insert battery #1 into black hood on strip and gently squeeze monitor battery onto connection as indicated  in instruction booklet. Set aside while preparing skin.  Choose location for your strip, vertical or horizontal, as indicated in the instruction booklet. Shave to remove all hair from location. There cannot be any lotions, oils, powders, or colognes on skin where monitor is to be applied. Wipe skin clean with enclosed Saline  wipe. Dry skin completely.  Peel paper labeled #1 off the back of the Monitor strip exposing the adhesive. Place the monitor on the chest in the vertical or horizontal position shown in the instruction booklet. One arrow on the monitor strip must be pointing upward. Carefully remove paper labeled #2, attaching remainder of strip to your skin. Try not to create any folds or wrinkles in the strip as you apply it.  Firmly press and release the circle in the center of the monitor battery. You will hear a small beep. This is turning the monitor battery on. The heart emblem on the monitor battery will light up every 5 seconds if the monitor battery in turned on and connected to the patient securely. Do not push and hold the circle down as this turns the monitor battery off. The cell phone will locate the monitor battery. A screen will appear on the cell phone checking the connection of your monitor strip. This may read poor connection initially but change to good connection within the next minute. Once your monitor accepts the connection you will hear a series of 3 beeps followed by a climbing crescendo of beeps. A screen will appear on the cell phone showing the two monitor strip placement options. Touch the picture that demonstrates where you applied the monitor strip.  Your monitor strip and battery are waterproof. You are able to shower, bathe, or swim with the monitor on. They just ask you do not submerge deeper than 3 feet underwater. We recommend removing the monitor if you are swimming in a lake, river, or ocean.  Your monitor battery will need to be switched to a fully charged monitor battery approximately once a week. The cell phone will alert you of an action which needs to be made.  On the cell phone, tap for details to reveal connection status, monitor battery status, and cell phone battery status. The green dots indicates your monitor is in good status. A red dot indicates there is  something that needs your attention.  To record a symptom, click the circle on the monitor battery. In 30-60 seconds a list of symptoms will appear on the cell phone. Select your symptom and tap save. Your monitor will record a sustained or significant arrhythmia regardless of you clicking the button. Some patients do not feel the heart rhythm irregularities. [employer] will notify us of any serious or critical events.  Refer to instruction booklet for instructions on switching batteries, changing strips, the Do not disturb or Pause features, or any additional questions.  Call [employer] at [phone], to confirm your monitor is transmitting and record your baseline. They will answer any questions you may have regarding the monitor instructions at that time.  Returning the monitor to [place] all equipment back into blue box. Peel off strip of paper to expose adhesive and close box securely. There is a prepaid UPS shipping label on this box. Drop in a UPS drop box, or at a UPS facility like [employer]. You may also contact [employer] to arrange UPS to pick up monitor package at your home.     Follow-Up: At [facility], you and your health needs are our priority.  As part of our continuing mission to provide you with exceptional heart care, we have created designated Provider Care Teams.  These Care Teams include your primary Cardiologist (physician) and Advanced Practice Providers (APPs -  Physician Assistants and Nurse Practitioners) who all work together to provide you with the care you need, when you need it.  We recommend signing up for the patient portal called "MyChart".  Sign up information is provided on this After Visit Summary.  MyChart is used to connect with patients for Virtual Visits (Telemedicine).  Patients are able to view lab/test results, encounter notes, upcoming appointments, etc.  Non-urgent messages can be sent to your provider as well.   To learn more  about what you can do with MyChart, go to [email or web address].    Your next appointment:   2 MONTHS  The format for your next appointment:   In Person  Provider:   You may see No primary care provider on file. or one of the following Advanced Practice Providers on your designated Care Team:    Almyra Deforest, PA-C  Fabian Sharp, [place] or   Roby Lofts, [place]    Other Instructions  Echocardiogram An echocardiogram is a procedure that uses painless sound waves (ultrasound) to produce an image of the heart. Images from an echocardiogram can provide important information about:  Signs of coronary artery disease (CAD).  Aneurysm detection. An aneurysm is a weak or damaged part of an artery wall that bulges out from the normal force of blood pumping through the body.  Heart size and shape. Changes in the size or shape of the heart can be associated with certain conditions, including heart failure, aneurysm, and CAD.  Heart muscle function.  Heart valve function.  Signs of a past heart attack.  Fluid buildup around the heart.  Thickening of the heart muscle.  A tumor or infectious growth around the heart valves. Tell a health care provider about:  Any allergies you have.  All medicines you are taking, including vitamins, herbs, eye drops, creams, and over-the-counter medicines.  Any blood disorders you have.  Any surgeries you have had.  Any medical conditions you have.  Whether you are pregnant or may be pregnant. What are the risks? Generally, this is a safe procedure. However, problems may occur, including:  Allergic reaction to dye (contrast) that may be used during the procedure. What happens before the procedure? No specific preparation is needed. You may eat and drink normally. What happens during the procedure?   An IV tube may be inserted into one of your veins.  You may receive contrast through this tube. A contrast is an injection that improves the  quality of the pictures from your heart.  A gel will be applied to your chest.  A wand-like tool (transducer) will be moved over your chest. The gel will help to transmit the sound waves from the transducer.  The sound waves will harmlessly bounce off of your heart to allow the heart images to be captured in real-time motion. The images will be recorded on a computer. The procedure may vary among health care providers and hospitals. What happens after the procedure?  You may return to your normal, everyday life, including diet, activities, and medicines, unless your health care provider tells you not to do that. Summary  An echocardiogram is a procedure that uses painless sound waves (ultrasound) to produce an image of the heart.  Images from an echocardiogram can provide important information about the size and  shape of your heart, heart muscle function, heart valve function, and fluid buildup around your heart.  You do not need to do anything to prepare before this procedure. You may eat and drink normally.  After the echocardiogram is completed, you may return to your normal, everyday life, unless your health care provider tells you not to do that. This information is not intended to replace advice given to you by your health care provider. Make sure you discuss any questions you have with your health care provider. Document Revised: 06/01/2018 Document Reviewed: 03/13/2016 Elsevier Patient Education  [place].

## 2019-11-02 ENCOUNTER — Ambulatory Visit (HOSPITAL_COMMUNITY)
Admission: RE | Admit: 2019-11-02 | Discharge: 2019-11-02 | Disposition: A | Discharge status: Home / Self Care | Payer: Managed Care, Other (non HMO) | Source: Ambulatory Visit | Attending: Cardiology | Admitting: Cardiology

## 2019-11-02 ENCOUNTER — Ambulatory Visit: Payer: Managed Care, Other (non HMO) | Admitting: Physician Assistant

## 2019-11-02 DIAGNOSIS — I340 Nonrheumatic mitral (valve) insufficiency: Secondary | ICD-10-CM

## 2019-11-02 DIAGNOSIS — I447 Left bundle-branch block, unspecified: Secondary | ICD-10-CM | POA: Diagnosis present

## 2019-11-02 DIAGNOSIS — R42 Dizziness and giddiness: Secondary | ICD-10-CM

## 2019-11-02 DIAGNOSIS — R55 Syncope and collapse: Secondary | ICD-10-CM | POA: Diagnosis not present

## 2019-11-02 DIAGNOSIS — I10 Essential (primary) hypertension: Secondary | ICD-10-CM | POA: Diagnosis not present

## 2019-11-12 ENCOUNTER — Ambulatory Visit (HOSPITAL_COMMUNITY): Payer: Managed Care, Other (non HMO) | Attending: Cardiology

## 2019-11-12 ENCOUNTER — Other Ambulatory Visit: Payer: Self-pay

## 2019-11-12 ENCOUNTER — Encounter (INDEPENDENT_AMBULATORY_CARE_PROVIDER_SITE_OTHER): Payer: Managed Care, Other (non HMO)

## 2019-11-12 DIAGNOSIS — I340 Nonrheumatic mitral (valve) insufficiency: Secondary | ICD-10-CM | POA: Diagnosis present

## 2019-11-12 DIAGNOSIS — I10 Essential (primary) hypertension: Secondary | ICD-10-CM | POA: Insufficient documentation

## 2019-11-12 DIAGNOSIS — I447 Left bundle-branch block, unspecified: Secondary | ICD-10-CM | POA: Insufficient documentation

## 2019-11-12 DIAGNOSIS — R55 Syncope and collapse: Secondary | ICD-10-CM | POA: Insufficient documentation

## 2019-11-12 DIAGNOSIS — R42 Dizziness and giddiness: Secondary | ICD-10-CM | POA: Diagnosis not present

## 2019-11-13 LAB — ECHOCARDIOGRAM COMPLETE
AR max vel: 1.46 cm2
AV Area VTI: 1.23 cm2
AV Area mean vel: 1.26 cm2
AV Mean grad: 9.5 mmHg
AV Peak grad: 17 mmHg
Ao pk vel: 2.06 m/s
Area-P 1/2: 3.01 cm2
S' Lateral: 2.9 cm

## 2019-12-22 ENCOUNTER — Other Ambulatory Visit: Payer: Self-pay

## 2019-12-22 ENCOUNTER — Ambulatory Visit: Payer: Managed Care, Other (non HMO) | Attending: Internal Medicine

## 2019-12-22 DIAGNOSIS — Z23 Encounter for immunization: Secondary | ICD-10-CM

## 2019-12-22 NOTE — Progress Notes (Signed)
   Covid-19 Vaccination Clinic  Name:  Brittney [name]    MRN: [id number] DOB: [birth year]  12/22/2019  [name] was observed post Covid-19 immunization for 15 minutes without incident. She was provided with Vaccine Information Sheet and instruction to access the V-Safe system.   [name] was instructed to call 911 with any severe reactions post vaccine: Marland Kitchen Difficulty breathing  . Swelling of face and throat  . A fast heartbeat  . A bad rash all over body  . Dizziness and weakness

## 2019-12-26 ENCOUNTER — Other Ambulatory Visit: Payer: Self-pay | Admitting: Cardiovascular Disease

## 2019-12-31 ENCOUNTER — Ambulatory Visit: Payer: Managed Care, Other (non HMO) | Admitting: Physician Assistant

## 2020-01-08 NOTE — Progress Notes (Signed)
Cardiology Office Note:    Date:  01/09/2020   ID:  Brittney [name], DOB Sep 19, 1960, MRN [id number]  PCP:  Troy Sine, MD  Cardiologist:  Shelva Majestic, MD   Referring MD: Troy Sine, MD   Chief Complaint  Patient presents with  . Follow-up    History of Present Illness:    Brittney [name] is a 59 y.o. female with a hx of type 1 DM, HTN, HLD, and hx of syncope. She was seen by Dr. Aundra Dubin in 2011 for syncope, felt to be vagally mediated. She established care wit Dr. Claiborne Billings 05/2016 for presyncope in the setting of new LBBB. Echo with normal LV function and mild DD. Dr. Claiborne Billings noted that left ventricular outflow tract below aortic valve was a subvalvular calcified ridge creating turbulence, but no valvular AS. Nuclear stress test was nonischemic. She was seen by Robbie Lis 10/2019 after an episode of syncope in August after standing for 1 hr in 9 degree heat. Workup in ER was reassuring with the exception of low TSH, for which she is following with endo. She had another episode of dizziness while driving. She had carotid doppler study in 10/2019 that was normal. Repeat echo 10/2019 was stable, no change. She is active at baseline and exercises 30 min 4 days per week (ellipitical).   She presents today for follow up. She recently completed an event monitor that showed predominantly sinus rhythm, no Afib or pauses. Pt triggered event was associated wish NSR HR 78. No additional syncope or pre-syncope. She reviews with me that she had syncope in the heat, negative workup in the ER. A week later, had a dizzy episode that prompted Vin to order her cardiac workup. She also reports other symptoms of changes in appetite and fatigue. She had a change in her thyroid medication and now feels great. No further episodes or symptoms.    Past Medical History:  Diagnosis Date  . Diabetes mellitus    has insulin pump  . Endocervical polyp   . Hyperlipidemia   . Hypertension   . Hypothyroidism   . Increased  blood pressure (not hypertension)   . Oligomenorrhea   . Thyroid disease   . Type 1 diabetes mellitus ([place])   . Uterine enlargement   . Uterine fibroid     Past Surgical History:  Procedure Laterality Date  . CESAREAN SECTION    . MYOMECTOMY ABDOMINAL APPROACH      Current Medications: Current Meds  Medication Sig  . amLODipine (NORVASC) 5 MG tablet TAKE 1 TABLET BY MOUTH EVERY DAY  . aspirin 81 MG tablet Take 81 mg by mouth daily.    . benazepril-hydrochlorthiazide (LOTENSIN HCT) 10-12.5 MG per tablet Take 1 tablet by mouth Twice daily.  . calcium carbonate (OS-CAL) 600 MG TABS Take 600 mg by mouth daily.    Marland Kitchen HUMALOG 100 UNIT/ML injection Inject into the skin Continuous. Via pump  . Multiple Vitamins-Minerals (MULTIVITAMIN WITH MINERALS) tablet Take 1 tablet by mouth daily.    . rosuvastatin (CRESTOR) 10 MG tablet TAKE 1 TABLET BY MOUTH EVERY DAY  . SYNTHROID 112 MCG tablet Take 1 tablet by mouth Daily.     Allergies:   Patient has no known allergies.   Social History   Socioeconomic History  . Marital status: Married    Spouse name: Not on file  . Number of children: Not on file  . Years of education: Not on file  . Highest education level: Not on  file  Occupational History  . Not on file  Tobacco Use  . Smoking status: Never Smoker  . Smokeless tobacco: Never Used  Substance and Sexual Activity  . Alcohol use: No  . Drug use: No  . Sexual activity: Yes    Birth control/protection: Post-menopausal  Other Topics Concern  . Not on file  Social History Narrative  . Not on file   Social Determinants of Health   Financial Resource Strain:   . Difficulty of Paying Living Expenses: Not on file  Food Insecurity:   . Worried About Charity fundraiser in the Last Year: Not on file  . Ran Out of Food in the Last Year: Not on file  Transportation Needs:   . Lack of Transportation (Medical): Not on file  . Lack of Transportation (Non-Medical): Not on file  Physical  Activity:   . Days of Exercise per Week: Not on file  . Minutes of Exercise per Session: Not on file  Stress:   . Feeling of Stress : Not on file  Social Connections:   . Frequency of Communication with Friends and Family: Not on file  . Frequency of Social Gatherings with Friends and Family: Not on file  . Attends Religious Services: Not on file  . Active Member of Clubs or Organizations: Not on file  . Attends Archivist Meetings: Not on file  . Marital Status: Not on file     Family History: The patient's family history includes Diabetes in her father, maternal grandfather, paternal grandfather, and sister; Hypertension in her father and sister; Hypothyroidism in her sister.  ROS:   Please see the history of present illness.     All other systems reviewed and are negative.  EKGs/Labs/Other Studies Reviewed:    The following studies were reviewed today:  Heart monitor 12/13/19: The patient was monitored for 30 days from November 12, 2019 until December 11, 2019.  The predominant rhythm was sinus rhythm with an average rate at 66 bpm.  Slowest heart rate was sinus bradycardia at 41 bpm which occurred at 1:46 AM on November 19, 2019.  The fastest heart rate was sinus tachycardia at 131 bpm which occurred on December 07, 2019 at 8:09 PM.  There was no ectopy.  There were no episodes of atrial fibrillation.  There were no pauses.  On November 12, 2019 and auto triggered event showed sinus rhythm at 78 bpm.   Echo 11/12/19: 1. There is a small subvalvular membrane in the LVOT, no significant  stenosis noted.. Left ventricular ejection fraction, by estimation, is 60  to 65%. The left ventricle has normal function. The left ventricle has no  regional wall motion abnormalities.  Left ventricular diastolic parameters were normal.  2. Right ventricular systolic function is normal. The right ventricular  size is normal. There is normal pulmonary artery systolic pressure.  3.  Mitral valve leaflet systolic bowing without prolapse, only trivial  MR. The mitral valve is normal in structure. Trivial mitral valve  regurgitation.  4. The aortic valve is grossly normal. Aortic valve regurgitation is not  visualized. No aortic stenosis is present.  5. The inferior vena cava is normal in size with <50% respiratory  variability, suggesting right atrial pressure of 8 mmHg.    Carotid dopplers 11/02/19: Summary:  Right Carotid: There is no evidence of stenosis in the right ICA. The extracranial vessels were near-normal with only minimal wall thickening or plaque.   Left Carotid: There is no evidence  of stenosis in the left ICA. The extracranial vessels were near-normal with only minimal wall thickening or plaque.   Vertebrals: Bilateral vertebral arteries demonstrate antegrade flow.  Subclavians: Normal flow hemodynamics were seen in bilateral subclavian        arteries.   EKG:  EKG is not ordered today.   Recent Labs: No results found for requested labs within last 8760 hours.  Recent Lipid Panel    Component Value Date/Time   CHOL 171 10/06/2018 0825   TRIG 54 10/06/2018 0825   HDL 85 10/06/2018 0825   CHOLHDL 2.0 10/06/2018 0825   CHOLHDL 2.2 06/21/2016 0815   VLDL 12 06/21/2016 0815   LDLCALC 75 10/06/2018 0825   LDLDIRECT 115.1 02/03/2010 0835    Physical Exam:    VS:  BP 132/60 (BP Location: Left Arm, Patient Position: Sitting, Cuff Size: Normal)   Pulse 76   Ht 5' 3.5" (1.613 m)   Wt 132 lb (59.9 kg)   BMI 23.02 kg/m     Wt Readings from Last 3 Encounters:  01/09/20 132 lb (59.9 kg)  11/01/19 134 lb 3.2 oz (60.9 kg)  07/19/19 135 lb (61.2 kg)     GEN:  Well nourished, well developed in no acute distress HEENT: Normal NECK: No JVD; No carotid bruits LYMPHATICS: No lymphadenopathy CARDIAC: RRR, no murmurs, rubs, gallops RESPIRATORY:  Clear to auscultation without rales, wheezing or rhonchi  ABDOMEN: Soft, non-tender,  non-distended MUSCULOSKELETAL:  No edema; No deformity  SKIN: Warm and dry NEUROLOGIC:  Alert and oriented x 3 PSYCHIATRIC:  Normal affect   ASSESSMENT:    1. SYNCOPE   2. Dizziness   3. Hyperlipidemia with target LDL less than 70   4. Essential hypertension    PLAN:    In order of problems listed above:  Syncope  Dizziness - she was advised to not drive for 6 months at her last OP visit 11/01/19 - heart monitor, echo, and carotid doppler studies are all unremarkable - her thyroid labs were abnormal and she has worked with endocrinology to adjust her synthroid - since adjusting her synthroid, she has had no further episodes of dizziness, pre-syncope, or syncope - I suspect her symptoms were due to abnormal thyroid studies  Her syncope occurred while standing, with a prodrome, thought to be due to vasovagal etiology and later found to have abnormal thyroid studies. She has had an adjustment to her medication and has been symptom free for greater than 2 months. I have cleared her to drive again.    Hypertension - amlodipine, benazepril-HCTZ - pressure well-controlled, no change in therapy   Hyperlipidemia - continue crestor 10 mg Last LDL 09/2018 was 75 - due for repeat lipids --> will come back in fasting state    Medication Adjustments/Labs and Tests Ordered: Current medicines are reviewed at length with the patient today.  Concerns regarding medicines are outlined above.  Orders Placed This Encounter  Procedures  . Lipid panel   No orders of the defined types were placed in this encounter.   Signed, Ledora Bottcher, PA  01/09/2020 4:44 PM    [facility]

## 2020-01-09 ENCOUNTER — Encounter: Payer: Self-pay | Admitting: Physician Assistant

## 2020-01-09 ENCOUNTER — Ambulatory Visit: Payer: Managed Care, Other (non HMO) | Admitting: Physician Assistant

## 2020-01-09 ENCOUNTER — Other Ambulatory Visit: Payer: Self-pay

## 2020-01-09 VITALS — BP 132/60 | HR 76 | Ht 63.5 in | Wt 132.0 lb

## 2020-01-09 DIAGNOSIS — R42 Dizziness and giddiness: Secondary | ICD-10-CM

## 2020-01-09 DIAGNOSIS — E785 Hyperlipidemia, unspecified: Secondary | ICD-10-CM

## 2020-01-09 DIAGNOSIS — R55 Syncope and collapse: Secondary | ICD-10-CM

## 2020-01-09 DIAGNOSIS — I10 Essential (primary) hypertension: Secondary | ICD-10-CM | POA: Diagnosis not present

## 2020-01-09 NOTE — Patient Instructions (Addendum)
Medication Instructions:  No Changes *If you need a refill on your cardiac medications before your next appointment, please call your pharmacy*   Lab Work:  Lipid Panel If you have labs (blood work) drawn today and your tests are completely normal, you will receive your results only by: Marland Kitchen MyChart Message (if you have MyChart) OR . A paper copy in the mail If you have any lab test that is abnormal or we need to change your treatment, we will call you to review the results.   Testing/Procedures: No Testing Ordered   Follow-Up: At [facility], you and your health needs are our priority.  As part of our continuing mission to provide you with exceptional heart care, we have created designated Provider Care Teams.  These Care Teams include your primary Cardiologist (physician) and Advanced Practice Providers (APPs -  Physician Assistants and Nurse Practitioners) who all work together to provide you with the care you need, when you need it.  Your next appointment:   1 year(s)  The format for your next appointment:   In Person  Provider:   Shelva Majestic, MD

## 2020-01-11 LAB — LIPID PANEL
Chol/HDL Ratio: 2 ratio (ref 0.0–4.4)
Cholesterol, Total: 169 mg/dL (ref 100–199)
HDL: 84 mg/dL (ref >39)
LDL Chol Calc (NIH): 75 mg/dL (ref 0–99)
Triglycerides: 49 mg/dL (ref 0–149)
VLDL Cholesterol Cal: 10 mg/dL (ref 5–40)

## 2020-09-19 ENCOUNTER — Other Ambulatory Visit: Payer: Self-pay | Admitting: Cardiovascular Disease

## 2021-01-08 ENCOUNTER — Encounter: Payer: Self-pay | Admitting: Internal Medicine

## 2021-02-12 ENCOUNTER — Telehealth: Payer: Self-pay

## 2021-02-12 ENCOUNTER — Telehealth: Payer: Self-pay | Admitting: *Deleted

## 2021-02-12 NOTE — Telephone Encounter (Signed)
Morning Magda Paganini:  This patient need insulin pump instructions please.  Thank you

## 2021-02-12 NOTE — Telephone Encounter (Signed)
Letter typed up and faxed today

## 2021-02-12 NOTE — Telephone Encounter (Signed)
See other phone note

## 2021-02-26 ENCOUNTER — Ambulatory Visit (AMBULATORY_SURGERY_CENTER): Payer: Managed Care, Other (non HMO) | Admitting: *Deleted

## 2021-02-26 ENCOUNTER — Other Ambulatory Visit: Payer: Self-pay

## 2021-02-26 VITALS — Ht 63.5 in | Wt 131.0 lb

## 2021-02-26 DIAGNOSIS — Z1211 Encounter for screening for malignant neoplasm of colon: Secondary | ICD-10-CM

## 2021-02-26 MED ORDER — NA SULFATE-K SULFATE-MG SULF 17.5-3.13-1.6 GM/177ML PO SOLN: 1.0000 | Freq: Once | ORAL | 0 refills | Status: AC

## 2021-02-26 MED ORDER — ONDANSETRON HCL 4 MG PO TABS: 4.0000 mg | ORAL_TABLET | Freq: Three times a day (TID) | ORAL | 0 refills | Status: DC | PRN

## 2021-02-26 NOTE — Progress Notes (Signed)
No egg or soy allergy known to patient  No issues known to pt with past sedation with any surgeries or procedures Patient denies ever being told they had issues or difficulty with intubation  No FH of Malignant Hyperthermia Pt is not on diet pills Pt is not on  home 02  Pt is not on blood thinners  Pt denies issues with constipation  No A fib or A flutter  Pt is fully vaccinated  for Covid   Discussed with pt there will be an out-of-pocket cost for prep and that varies from $0 to 70 +  dollars - pt verbalized understanding   Due to the COVID-19 pandemic we are asking patients to follow certain guidelines in [place] and the [facility] [place] [facility]   Pt aware of COVID protocols and [facility] guidelines    Pt. Concerned about keeping prep down zofran 4 mg prescribed.

## 2021-02-27 NOTE — Telephone Encounter (Signed)
Spoke with patient and communicated insulin pump instructions from her MD:  stay on pump, no change to settings, bolus for carbs the day before, resume normal operation when eating.  Patient agreed.

## 2021-03-13 ENCOUNTER — Encounter: Payer: Self-pay | Admitting: Internal Medicine

## 2021-03-19 ENCOUNTER — Ambulatory Visit (AMBULATORY_SURGERY_CENTER): Payer: Managed Care, Other (non HMO) | Admitting: Internal Medicine

## 2021-03-19 ENCOUNTER — Encounter: Payer: Self-pay | Admitting: Internal Medicine

## 2021-03-19 ENCOUNTER — Other Ambulatory Visit: Payer: Self-pay

## 2021-03-19 VITALS — BP 122/60 | HR 66 | Temp 97.8°F | Resp 14 | Ht 63.5 in | Wt 131.0 lb

## 2021-03-19 DIAGNOSIS — Z86010 Personal history of colonic polyps: Secondary | ICD-10-CM | POA: Diagnosis not present

## 2021-03-19 DIAGNOSIS — Z09 Encounter for follow-up examination after completed treatment for conditions other than malignant neoplasm: Secondary | ICD-10-CM | POA: Diagnosis present

## 2021-03-19 DIAGNOSIS — Z1211 Encounter for screening for malignant neoplasm of colon: Secondary | ICD-10-CM

## 2021-03-19 MED ORDER — SODIUM CHLORIDE 0.9 % IV SOLN: 500.0000 mL | Freq: Once | INTRAVENOUS | Status: DC

## 2021-03-19 NOTE — Patient Instructions (Signed)
YOU HAD AN ENDOSCOPIC PROCEDURE TODAY AT [facility]:   Refer to the procedure report that was given to you for any specific questions about what was found during the examination.  If the procedure report does not answer your questions, please call your gastroenterologist to clarify.  If you requested that your care partner not be given the details of your procedure findings, then the procedure report has been included in a sealed envelope for you to review at your convenience later.  YOU SHOULD EXPECT: Some feelings of bloating in the abdomen. Passage of more gas than usual.  Walking can help get rid of the air that was put into your GI tract during the procedure and reduce the bloating. If you had a lower endoscopy (such as a colonoscopy or flexible sigmoidoscopy) you may notice spotting of blood in your stool or on the toilet paper. If you underwent a bowel prep for your procedure, you may not have a normal bowel movement for a few days.  Please Note:  You might notice some irritation and congestion in your nose or some drainage.  This is from the oxygen used during your procedure.  There is no need for concern and it should clear up in a day or so.  SYMPTOMS TO REPORT IMMEDIATELY:  Following lower endoscopy (colonoscopy):  Excessive amounts of blood in the stool  Significant tenderness or worsening of abdominal pains  Swelling of the abdomen that is new, acute  Fever of 100F or higher  For urgent or emergent issues, a gastroenterologist can be reached at any hour by calling [phone]. Do not use MyChart messaging for urgent concerns.    DIET:  We do recommend a small meal at first, but then you may proceed to your regular diet.  Drink plenty of fluids but you should avoid alcoholic beverages for 24 hours.  ACTIVITY:  You should plan to take it easy for the rest of today and you should NOT DRIVE or use heavy machinery until tomorrow (because of the sedation medicines  used during the test).    FOLLOW UP: Our staff will call the number listed on your records 48-72 hours following your procedure to check on you and address any questions or concerns that you may have regarding the information given to you following your procedure. If we do not reach you, we will leave a message.  We will attempt to reach you two times.  During this call, we will ask if you have developed any symptoms of COVID 19. If you develop any symptoms (ie: fever, flu-like symptoms, shortness of breath, cough etc.) before then, please call [phone].  If you test positive for Covid 19 in the 2 weeks post procedure, please call and report this information to [place].    There were no polyps seen today!  You will need another screening colonoscopy in 10 years, you will receive a letter at that time when you are due for the procedure.   Please call us at [phone] if you have a change in bowel habits, change in family history of colo-rectal cancer, rectal bleeding or other GI concern before that time.   SIGNATURES/CONFIDENTIALITY: You and/or your care partner have signed paperwork which will be entered into your electronic medical record.  These signatures attest to the fact that that the information above on your After Visit Summary has been reviewed and is understood.  Full responsibility of the confidentiality of this discharge information lies with you and/or  your care-partner.

## 2021-03-19 NOTE — Progress Notes (Signed)
HISTORY OF PRESENT ILLNESS:  Brittney [name] is a 61 y.o. female female with diabetes mellitus who presents today for surveillance colonoscopy.  Index examination June 2012 with nonadvanced adenoma.  No active complaints  REVIEW OF SYSTEMS:  All non-GI ROS negative.  Past Medical History:  Diagnosis Date   Diabetes mellitus    has insulin pump   Endocervical polyp    Heart murmur    10 years ago   Hyperlipidemia    Hypertension    Hypothyroidism    Increased blood pressure (not hypertension)    Oligomenorrhea    Thyroid disease    Type 1 diabetes mellitus ([place])    Uterine enlargement    Uterine fibroid     Past Surgical History:  Procedure Laterality Date   CESAREAN SECTION     2021   MYOMECTOMY ABDOMINAL APPROACH     2000    Social History Brittney [name]  reports that she has never smoked. She has never used smokeless tobacco. She reports that she does not drink alcohol and does not use drugs.  family history includes Colon cancer in her paternal aunt; Diabetes in her father, maternal grandfather, paternal grandfather, and sister; Hypertension in her father and sister; Hypothyroidism in her sister.  No Known Allergies     PHYSICAL EXAMINATION:  Vital signs: BP (!) 94/52    Pulse 74    Temp 97.8 F (36.6 C)    Resp 11    Ht 5' 3.5" (1.613 m)    Wt 131 lb (59.4 kg)    SpO2 99%    BMI 22.84 kg/m  General: Well-developed, well-nourished, no acute distress HEENT: Sclerae are anicteric, conjunctiva pink. Oral mucosa intact Lungs: Clear Heart: Regular Abdomen: soft, nontender, nondistended, no obvious ascites, no peritoneal signs, normal bowel sounds. No organomegaly. Extremities: No edema Psychiatric: alert and oriented x3. Cooperative     ASSESSMENT:  1.  History of nonadvanced adenoma 2012.  Due for follow-up   PLAN:   1.  Surveillance colonoscopy

## 2021-03-19 NOTE — Progress Notes (Signed)
To pacu, VSS. Report to [email or web address] 

## 2021-03-19 NOTE — Progress Notes (Signed)
VS-CW  Pt's states no medical or surgical changes since previsit or office visit.  

## 2021-03-19 NOTE — Op Note (Signed)
[facility] Patient Name: Brittney [name] Procedure Date: 03/19/2021 7:20 AM MRN: [id number] Endoscopist: Docia Chuck. Henrene Pastor , MD Age: 61 Referring MD:  Date of Birth: [birth year] Gender: Female Account #: [id number] Procedure:                Colonoscopy Indications:              High risk colon cancer surveillance: Personal                            history of non-advanced adenoma (07-2010) Medicines:                Monitored Anesthesia Care Procedure:                Pre-Anesthesia Assessment:                           - Prior to the procedure, a History and Physical                            was performed, and patient medications and                            allergies were reviewed. The patient's tolerance of                            previous anesthesia was also reviewed. The risks                            and benefits of the procedure and the sedation                            options and risks were discussed with the patient.                            All questions were answered, and informed consent                            was obtained. Prior Anticoagulants: The patient has                            taken no previous anticoagulant or antiplatelet                            agents. ASA Grade Assessment: III - A patient with                            severe systemic disease. After reviewing the risks                            and benefits, the patient was deemed in                            satisfactory condition to undergo the procedure.  After obtaining informed consent, the colonoscope                            was passed under direct vision. Throughout the                            procedure, the patient's blood pressure, pulse, and                            oxygen saturations were monitored continuously. The                            CF [id number] #[id number] was introduced through the anus                            and advanced to the the  cecum, identified by                            appendiceal orifice and ileocecal valve. The                            ileocecal valve, appendiceal orifice, and rectum                            were photographed. The quality of the bowel                            preparation was excellent. The colonoscopy was                            performed without difficulty. The patient tolerated                            the procedure well. The bowel preparation used was                            SUPREP via split dose instruction. Scope In: 8:20:35 AM Scope Out: 8:34:05 AM Scope Withdrawal Time: 0 hours 10 minutes 18 seconds  Total Procedure Duration: 0 hours 13 minutes 30 seconds  Findings:                 The entire examined colon appeared normal on direct                            and retroflexion views. Complications:            No immediate complications. Estimated blood loss:                            None. Estimated Blood Loss:     Estimated blood loss: none. Impression:               - The entire examined colon is normal on direct and  retroflexion views.                           - No specimens collected. Recommendation:           - Repeat colonoscopy in 10 years for surveillance.                           - Patient has a contact number available for                            emergencies. The signs and symptoms of potential                            delayed complications were discussed with the                            patient. Return to normal activities tomorrow.                            Written discharge instructions were provided to the                            patient.                           - Resume previous diet.                           - Continue present medications. Docia Chuck. Henrene Pastor, MD 03/19/2021 8:49:40 AM This report has been signed electronically.

## 2021-03-23 ENCOUNTER — Telehealth: Payer: Self-pay

## 2021-03-23 NOTE — Telephone Encounter (Signed)
°  Follow up Call-  Call back number 03/19/2021  Post procedure Call Back phone  # [phone]  Permission to leave phone message Yes  Some recent data might be hidden     Patient questions:  Do you have a fever, pain , or abdominal swelling? No. Pain Score  0 *  Have you tolerated food without any problems? Yes.    Have you been able to return to your normal activities? Yes.    Do you have any questions about your discharge instructions: Diet   No. Medications  No. Follow up visit  No.  Do you have questions or concerns about your Care? No.  Actions: * If pain score is 4 or above: No action needed, pain <4.

## 2021-05-13 ENCOUNTER — Encounter: Payer: Self-pay | Admitting: Cardiovascular Disease

## 2021-05-13 ENCOUNTER — Ambulatory Visit: Payer: Managed Care, Other (non HMO) | Admitting: Cardiovascular Disease

## 2021-05-13 ENCOUNTER — Other Ambulatory Visit: Payer: Self-pay

## 2021-05-13 DIAGNOSIS — E039 Hypothyroidism, unspecified: Secondary | ICD-10-CM

## 2021-05-13 DIAGNOSIS — I340 Nonrheumatic mitral (valve) insufficiency: Secondary | ICD-10-CM | POA: Diagnosis not present

## 2021-05-13 DIAGNOSIS — I447 Left bundle-branch block, unspecified: Secondary | ICD-10-CM

## 2021-05-13 DIAGNOSIS — E109 Type 1 diabetes mellitus without complications: Secondary | ICD-10-CM

## 2021-05-13 DIAGNOSIS — I10 Essential (primary) hypertension: Secondary | ICD-10-CM | POA: Diagnosis not present

## 2021-05-13 DIAGNOSIS — E785 Hyperlipidemia, unspecified: Secondary | ICD-10-CM

## 2021-05-13 NOTE — Progress Notes (Signed)
? ?Cardiology Office Note   ? ?Date:  05/19/2021  ? ?ID:  Brittney [name], DOB 10-Mar-1960, MRN [id number] ? ?PCP:  Dr. Kendall Flack ?Cardiologist:  Shelva Majestic, MD  ? ?No chief complaint on file. ? ? ?History of Present Illness:  ?Brittney [name] is a 61 y.o. female who is referred by Dr. Davonna Belling following emergency room evaluation for presyncope where she was found to have left bundle branch block. I saw her for initial evaluation on 06/28/2016 and last evaluated her in May 2021.  She presents for follow-up evaluation.   ? ?[name] has a history of type 1 diabetes mellitus since age 79.  She is followed very closely by Dr. Joneen [name] in endocrinology at [facility].  In 1999, she was started on an insulin pump. She has a history of hypertension and was started on medication approximately  20 years years ago.  She has been taking, benazapril/HCT 10/12.5 twice a day.  She has a history of hyperlipidemia and initially was started on pravastatin 20 mg daily.  She has a history of hypothyroidism for which she has been taking Synthroid 112 ?g daily.  She denies any awareness of chest pain, PND, orthopnea.  She is unaware of palpitations.  On 06/17/2016 she developed a near syncopal episode while at work.  Apparently, she had just walked through a patch of bugs and then started to itch all over.  She suddenly became lightheaded, but did not lose consciousness or fall.  Due to persistent symptoms, EMS was called.  Her CBG was 90.  She has an insulin pump and had eaten breakfast, but had not had lunch.  She had a similar episode in 2011 which was attributed to a vasovagal episode.  In the ER her blood pressure was 131/61.  Her pulse was 60.  She was not anemic, but white count was 17.8.  A bmet was normal with a creatinine of 1.24, glucose 148.  Her ECG showed sinus rhythm with a new left bundle branch block from her 2011 ECG.  In the emergency room, she had a negative workup.  A chest x-ray was negative.  Troponin  was negative.  Because of her new left bundle branch block, she is now referred for cardiology consultation and evaluation.  Upon arrival to the emergency room, she denied fevers, chills, headache, visual changes, cough, shortness of breath, chest pain, palpitations, abdominal pain, nausea, or vomiting.  She denied any paresthesias.  While in the emergency room, her blood pressure was 131/61.  Her pulse was 60.  She had negative orthostatics.  Her CBG was 178.  Laboratory revealed an elevated white blood count at 17.8 with a normal hemoglobin and hematocrit.  Cr was 1.24 with an anion gap of 17, which was felt possibly due to mild dehydration.  Her chest x-ray was negative.  Troponin was negative.  Her ECG showed sinus rhythm but there was a new left bundle branch block.  Compared to her prior ECG of 2011.  Because of this new left bundle branch block.  She was  referred for cardiology consultation and evaluation. ? ?When I saw her for my initial evaluation, she was hypertensive and recommended initiation of low-dose amlodipine at 2.5 mg daily for more optimal blood pressure control.  She had a 2/6 systolic murmur left sternal border and is scheduled her for an echo Doppler study.  This demonstrated normal LV function with mild grade 1 diastolic dysfunction.  The left ventricle outflow tract  below the aortic valve.  There was a subvalvular calcified ridge which created turbulence resulting in a mean gradient of 11.  There was no valvular aortic stenosis.  She underwent repeat laboratory which revealed a total cholesterol 181, triglycerides 58, HDL 83, and LDL 86.  With her long-standing diabetes mellitus, history I referred her for nuclear stress test.  This revealed normal perfusion with an ejection fraction of 58% without evidence for scar or ischemia.  ? ?She was evaluated in January 2019 by Brittney Sims, NP and remained fairly stable.  She underwent a follow-up echo Doppler study in July 2019 which continue  to show normal systolic function with an EF of 55 to 60%.  There was systolic bowing of her mitral valve without prolapse with mild MR directed eccentrically and posteriorly.  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 ? ?I saw her on April 26, 2018. She is monitored by Dr. Joneen [name] at [facility] at six-month intervals for her type 1 diabetes mellitus.  She brought with her complete set of laboratory from March 28, 2018.  Of note, her lipids had increased and her total cholesterol was 227 with an LDL cholesterol of 126, HDL cholesterol at 87 and triglycerides at 70.  Laboratory was otherwise normal except there was very mild urine microalbumin.  She continues to be on pravastatin at 20 mg amlodipine 5 mg, benazepril/HCTZ 10/12.5 mg in addition to her levothyroxine 112 mcg and Humalog insulin via pump.  She denies chest pain, PND, orthopnea, palpitations and presyncope or syncope.   ? ?I last evaluated her in a telemedicine visit on Jul 19, 2019.  Her blood pressure was controlled on benazepril HCT 10/12.5.  She was tolerating rosuvastatin which I had changed from pravastatin previously.  She continues to be on levothyroxine for hypothyroidism. ? ?Since I last saw her, she has remained cardiac stable.  She has undergone laboratory at [facility].  She specifically denies chest pain shortness of breath or dizziness.  Hemoglobin A1c was 7.4.  She continues to be on amlodipine 5 mg, benazepril HCT 10/12.5 mg, levothyroxine 125 mcg in addition to rosuvastatin 10 mg.  She is on Humalog insulin in the insulin pump.  She presents for reevaluation. ? ? ?Past Medical History:  ?Diagnosis Date  ? Diabetes mellitus   ? has insulin pump  ? Endocervical polyp   ? Heart murmur   ? 10 years ago  ? Hyperlipidemia   ? Hypertension   ? Hypothyroidism   ? Increased blood pressure (not hypertension)   ? Oligomenorrhea   ?  Thyroid disease   ? Type 1 diabetes mellitus ([place])   ? Uterine enlargement   ? Uterine fibroid   ? ? ?Past Surgical History:  ?Procedure Laterality Date  ? CESAREAN SECTION    ? 2021  ? MYOMECTOMY ABDOMINAL APPROACH    ? 2000  ? ? ?Current Medications: ?Outpatient Medications Prior to Visit  ?Medication Sig Dispense Refill  ? amLODipine (NORVASC) 5 MG tablet TAKE 1 TABLET BY MOUTH EVERY DAY 90 tablet 2  ? aspirin 81 MG tablet Take 81 mg by mouth daily.      ? benazepril-hydrochlorthiazide (LOTENSIN HCT) 10-12.5 MG per tablet Take 1 tablet by mouth Twice daily.    ? glucose blood (ONETOUCH ULTRA) test strip TEST 8 TIMES DAILY. ONE TOUCH ULTRA TEST STRIPS E10.9    ? HUMALOG 100 UNIT/ML injection Inject  into the skin Continuous. Via pump    ? levothyroxine (SYNTHROID) 125 MCG tablet Take by mouth.    ? Multiple Vitamins-Minerals (MULTIVITAMIN WITH MINERALS) tablet Take 1 tablet by mouth daily.      ? ONETOUCH ULTRA test strip TEST 8 TIMES DAILY. ONE TOUCH ULTRA TEST STRIPS E10.9    ? rosuvastatin (CRESTOR) 10 MG tablet TAKE 1 TABLET BY MOUTH EVERY DAY 90 tablet 3  ? ondansetron (ZOFRAN) 4 MG tablet Take 1 tablet (4 mg total) by mouth every 8 (eight) hours as needed for nausea or vomiting. Take 30-60 minutes prior to each prep dose 2 tablet 0  ? ?No facility-administered medications prior to visit.  ?  ? ?Allergies:   Patient has no known allergies.  ? ?Social History  ? ?Socioeconomic History  ? Marital status: Married  ?  Spouse name: Not on file  ? Number of children: Not on file  ? Years of education: Not on file  ? Highest education level: Not on file  ?Occupational History  ? Not on file  ?Tobacco Use  ? Smoking status: Never  ? Smokeless tobacco: Never  ?Vaping Use  ? Vaping Use: Never used  ?Substance and Sexual Activity  ? Alcohol use: No  ? Drug use: No  ? Sexual activity: Yes  ?  Birth control/protection: Post-menopausal  ?Other Topics Concern  ? Not on file  ?Social History Narrative  ? Not on file   ? ?Social Determinants of Health  ? ?Financial Resource Strain: Not on file  ?Food Insecurity: Not on file  ?Transportation Needs: Not on file  ?Physical Activity: Not on file  ?Stress: Not on file  ?Social Con

## 2021-05-13 NOTE — Patient Instructions (Signed)
Medication Instructions:  ?Your physician recommends that you continue on your current medications as directed. Please refer to the Current Medication list given to you today. ? ?*If you need a refill on your cardiac medications before your next appointment, please call your pharmacy* ? ? ?Testing/Procedures: ?Your physician has requested that you have an echocardiogram. Echocardiography is a painless test that uses sound waves to create images of your heart. It provides your doctor with information about the size and shape of your heart and how well your heart?s chambers and valves are working. This procedure takes approximately one hour. There are no restrictions for this procedure. To be done in March 2024. This procedure is done at [address] ? ? ? ?Follow-Up: ?At [facility], you and your health needs are our priority.  As part of our continuing mission to provide you with exceptional heart care, we have created designated Provider Care Teams.  These Care Teams include your primary Cardiologist (physician) and Advanced Practice Providers (APPs -  Physician Assistants and Nurse Practitioners) who all work together to provide you with the care you need, when you need it. ? ?We recommend signing up for the patient portal called "MyChart".  Sign up information is provided on this After Visit Summary.  MyChart is used to connect with patients for Virtual Visits (Telemedicine).  Patients are able to view lab/test results, encounter notes, upcoming appointments, etc.  Non-urgent messages can be sent to your provider as well.   ?To learn more about what you can do with MyChart, go to [email or web address].   ? ?Your next appointment:   ?12 month(s) ? ?The format for your next appointment:   ?In Person ? ?Provider:   ?Shelva Majestic, MD ?

## 2021-05-19 ENCOUNTER — Encounter: Payer: Self-pay | Admitting: Cardiovascular Disease

## 2021-05-27 ENCOUNTER — Emergency Department (HOSPITAL_COMMUNITY)
Admission: EM | Admit: 2021-05-27 | Discharge: 2021-05-27 | Disposition: A | Discharge status: Home / Self Care | Payer: Managed Care, Other (non HMO) | Attending: Emergency Medicine | Admitting: Emergency Medicine

## 2021-05-27 ENCOUNTER — Emergency Department (HOSPITAL_COMMUNITY): Payer: Managed Care, Other (non HMO)

## 2021-05-27 ENCOUNTER — Other Ambulatory Visit: Payer: Self-pay

## 2021-05-27 DIAGNOSIS — Z794 Long term (current) use of insulin: Secondary | ICD-10-CM | POA: Diagnosis not present

## 2021-05-27 DIAGNOSIS — R42 Dizziness and giddiness: Secondary | ICD-10-CM | POA: Insufficient documentation

## 2021-05-27 DIAGNOSIS — Z7982 Long term (current) use of aspirin: Secondary | ICD-10-CM | POA: Insufficient documentation

## 2021-05-27 DIAGNOSIS — R55 Syncope and collapse: Secondary | ICD-10-CM | POA: Insufficient documentation

## 2021-05-27 LAB — BASIC METABOLIC PANEL
Anion gap: 12 (ref 5–15)
BUN: 16 mg/dL (ref 8–23)
CO2: 21 mmol/L — ABNORMAL LOW (ref 22–32)
Calcium: 9.3 mg/dL (ref 8.9–10.3)
Chloride: 103 mmol/L (ref 98–111)
Creatinine, Ser: 1.1 mg/dL — ABNORMAL HIGH (ref 0.44–1.00)
GFR, Estimated: 57 mL/min — ABNORMAL LOW (ref >60)
Glucose, Bld: 144 mg/dL — ABNORMAL HIGH (ref 70–99)
Potassium: 3.4 mmol/L — ABNORMAL LOW (ref 3.5–5.1)
Sodium: 136 mmol/L (ref 135–145)

## 2021-05-27 LAB — CBC WITH DIFFERENTIAL/PLATELET
Abs Immature Granulocytes: 0.11 10*3/uL — ABNORMAL HIGH (ref 0.00–0.07)
Basophils Absolute: 0.1 10*3/uL (ref 0.0–0.1)
Basophils Relative: 0 %
Eosinophils Absolute: 0.1 10*3/uL (ref 0.0–0.5)
Eosinophils Relative: 1 %
HCT: 45.7 % (ref 36.0–46.0)
Hemoglobin: 15.2 g/dL — ABNORMAL HIGH (ref 12.0–15.0)
Immature Granulocytes: 1 %
Lymphocytes Relative: 11 %
Lymphs Abs: 2.3 10*3/uL (ref 0.7–4.0)
MCH: 30.2 pg (ref 26.0–34.0)
MCHC: 33.3 g/dL (ref 30.0–36.0)
MCV: 90.7 fL (ref 80.0–100.0)
Monocytes Absolute: 1.1 10*3/uL — ABNORMAL HIGH (ref 0.1–1.0)
Monocytes Relative: 5 %
Neutro Abs: 16.3 10*3/uL — ABNORMAL HIGH (ref 1.7–7.7)
Neutrophils Relative %: 82 %
Platelets: 222 10*3/uL (ref 150–400)
RBC: 5.04 MIL/uL (ref 3.87–5.11)
RDW: 13.1 % (ref 11.5–15.5)
WBC: 19.9 10*3/uL — ABNORMAL HIGH (ref 4.0–10.5)
nRBC: 0 % (ref 0.0–0.2)

## 2021-05-27 LAB — HEPATIC FUNCTION PANEL
ALT: 30 U/L (ref 0–44)
AST: 30 U/L (ref 15–41)
Albumin: 3.7 g/dL (ref 3.5–5.0)
Alkaline Phosphatase: 53 U/L (ref 38–126)
Bilirubin, Direct: <0.1 mg/dL (ref 0.0–0.2)
Total Bilirubin: 0.3 mg/dL (ref 0.3–1.2)
Total Protein: 6.1 g/dL — ABNORMAL LOW (ref 6.5–8.1)

## 2021-05-27 LAB — URINALYSIS, ROUTINE W REFLEX MICROSCOPIC
Bilirubin Urine: NEGATIVE
Glucose, UA: NEGATIVE mg/dL
Hgb urine dipstick: NEGATIVE
Ketones, ur: 5 mg/dL — AB
Leukocytes,Ua: NEGATIVE
Nitrite: NEGATIVE
Protein, ur: NEGATIVE mg/dL
Specific Gravity, Urine: 1.009 (ref 1.005–1.030)
pH: 6 (ref 5.0–8.0)

## 2021-05-27 LAB — MAGNESIUM: Magnesium: 1.8 mg/dL (ref 1.7–2.4)

## 2021-05-27 LAB — LIPASE, BLOOD: Lipase: 29 U/L (ref 11–51)

## 2021-05-27 LAB — TROPONIN I (HIGH SENSITIVITY)
Troponin I (High Sensitivity): 6 ng/L (ref <18)
Troponin I (High Sensitivity): 7 ng/L (ref <18)

## 2021-05-27 MED ORDER — SODIUM CHLORIDE 0.9 % IV BOLUS
500.0000 mL | Freq: Once | INTRAVENOUS | Status: AC
  Administered 2021-05-27: 500 mL via INTRAVENOUS

## 2021-05-27 NOTE — Discharge Instructions (Signed)
You have been seen today in the Emergency Department (ED)  for syncope (passing out).  Your workup including labs and EKG show reassuring results.   ? ?Please call your regular doctor as soon as possible to schedule the next available clinic appointment to follow up with him/her regarding your visit to the ED and your symptoms.  Return to the Emergency Department (ED)  if you have any further syncopal episodes (pass out again) or develop ANY chest pain, pressure, tightness, trouble breathing, sudden sweating, or other symptoms that concern you. ? ?

## 2021-05-27 NOTE — ED Triage Notes (Signed)
Pt BIB [facility] from work after having a syncopal episode. Pt did not fall or hit anything as she was assisted by a coworker to the ground. 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 As patient was standing up EMS was unable to palpate a radial pulse but BP was actually stable at 124/78. Pt did have 2 witnessed syncopal episodes today. Pt given 500 ml NS. Pt currently experiencing one episode of diarrhea. Aox4.  ?

## 2021-05-27 NOTE — ED Provider Notes (Signed)
Blood pressure (!) 139/58, pulse 70, temperature 98.2 ?F (36.8 ?C), temperature source Temporal, resp. rate 13, height '5\' 3"'$  (1.6 m), weight 60.8 kg, SpO2 100 %. ? ?Assuming care from Dr. Langston Masker.  In short, Brittney [name] is a 61 y.o. female with a chief complaint of Loss of Consciousness ?Marland Kitchen  Refer to the original H&P for additional details. ? ?The current plan of care is to follow up on labs and reassess. ? ?03:40 PM  ?Patient with leukocytosis but no infection symptoms. No abdominal pain or tenderness. Normal labs. Patient feeling much better after BM. Patient established with Cardiology. Will call for follow up.  ? ? EKG Interpretation ? ?Date/Time:  Wednesday May 27 2021 13:56:39 EDT ?Ventricular Rate:  67 ?PR Interval:  145 ?QRS Duration: 125 ?QT Interval:  450 ?QTC Calculation: 476 ?R Axis:   53 ?Text Interpretation: Sinus rhythm Consider left atrial enlargement Left bundle branch block No sig change from prior tracing April 2018, no STEMI Confirmed by Octaviano Glow [phone]) on 05/27/2021 2:07:05 PM ?  ? ?  ? ? ? ?  ?Margette Fast, MD ?05/27/21 1558 ? ?

## 2021-05-27 NOTE — ED Provider Notes (Signed)
?[facility] ?Provider Note ? ? ?CSN: [id number] ?Arrival date & time: 05/27/21  1338 ? ?  ? ?History ? ?Chief Complaint  ?Patient presents with  ? Loss of Consciousness  ? ? ?Brittney [name] is a 61 y.o. female presenting to the emergency department with syncope.  Patient reports loss of consciousness today while at school.  She says she began to feel very overheated, lightheaded, began having darkening vision, felt pressure in her bowels like she needed to have a bowel movement.  She was with the school nurse when the patient had loss of consciousness.  Initially had a blood pressure that was low 70/40 per EMS report, and thready radial pulse.  Since arriving in the ED, the patient was able to have a bowel movement and says she feels significantly better with no rectal pressure and denies lightheadedness, shortness of breath, chest pain or pressure.  She reports prior history of syncope, once perhaps 2 years ago and then 12 years ago, associated with heat and exertion in the past.  She has seen cardiology and had an echocardiogram in Sept 2021 showing normal EF; also had vascular carotid Doppler ultrasounds which were unremarkable; and wore a telemetry monitor which was unremarkable from the cardiologist report. ? ? ? 1. There is a small subvalvular membrane in the LVOT, no significant  ?stenosis noted.. Left ventricular ejection fraction, by estimation, is 60  ?to 65%. The left ventricle has normal function. The left ventricle has no  ?regional wall motion abnormalities.  ?Left ventricular diastolic parameters were normal.  ? 2. Right ventricular systolic function is normal. The right ventricular  ?size is normal. There is normal pulmonary artery systolic pressure.  ? 3. Mitral valve leaflet systolic bowing without prolapse, only trivial  ?MR. The mitral valve is normal in structure. Trivial mitral valve  ?regurgitation.  ? 4. The aortic valve is grossly normal. Aortic valve  regurgitation is not  ?visualized. No aortic stenosis is present.  ? 5. The inferior vena cava is normal in size with <50% respiratory  ?variability, suggesting right atrial pressure of 8 mmHg.  ? ?HPI ? ?  ? ?Home Medications ?Prior to Admission medications   ?Medication Sig Start Date End Date Taking? Authorizing Provider  ?amLODipine (NORVASC) 5 MG tablet TAKE 1 TABLET BY MOUTH EVERY DAY 09/22/20   Troy Sine, MD  ?aspirin 81 MG tablet Take 81 mg by mouth daily.      [provider]  ?benazepril-hydrochlorthiazide (LOTENSIN HCT) 10-12.5 MG per tablet Take 1 tablet by mouth Twice daily. 06/29/10   [provider]  ?glucose blood (ONETOUCH ULTRA) test strip TEST 8 TIMES DAILY. ONE TOUCH ULTRA TEST STRIPS E10.9 09/19/20   [provider]  ?HUMALOG 100 UNIT/ML injection Inject into the skin Continuous. Via pump 07/22/10   [provider]  ?levothyroxine (SYNTHROID) 125 MCG tablet Take by mouth. 12/19/20   [provider]  ?Multiple Vitamins-Minerals (MULTIVITAMIN WITH MINERALS) tablet Take 1 tablet by mouth daily.      [provider]  ?Donald Siva test strip TEST 8 TIMES DAILY. ONE TOUCH ULTRA TEST STRIPS E10.9 12/19/20   [provider]  ?rosuvastatin (CRESTOR) 10 MG tablet TAKE 1 TABLET BY MOUTH EVERY DAY 08/13/19   Troy Sine, MD  ?   ? ?Allergies    ?Patient has no known allergies.   ? ?Review of Systems   ?Review of Systems ? ?Physical Exam ?Updated Vital Signs ?BP (!) 164/72 (  BP Location: Right Arm)   Pulse 66   Temp 98.2 ?F (36.8 ?C) (Temporal)   Resp 14   Ht '5\' 3"'$  (1.6 m)   Wt 60.8 kg   SpO2 100%   BMI 23.74 kg/m?  ?Physical Exam ?Constitutional:   ?   General: She is not in acute distress. ?HENT:  ?   Head: Normocephalic and atraumatic.  ?Eyes:  ?   Conjunctiva/sclera: Conjunctivae normal.  ?   Pupils: Pupils are equal, round, and reactive to light.  ?Cardiovascular:  ?   Rate and Rhythm: Normal rate and regular rhythm.  ?Pulmonary:   ?   Effort: Pulmonary effort is normal. No respiratory distress.  ?Abdominal:  ?   General: There is no distension.  ?   Tenderness: There is no abdominal tenderness.  ?Skin: ?   General: Skin is warm and dry.  ?Neurological:  ?   General: No focal deficit present.  ?   Mental Status: She is alert. Mental status is at baseline.  ?Psychiatric:     ?   Mood and Affect: Mood normal.     ?   Behavior: Behavior normal.  ? ? ?ED Results / Procedures / Treatments   ?Labs ?(all labs ordered are listed, but only abnormal results are displayed) ?Labs Reviewed  ?BASIC METABOLIC PANEL  ?CBC WITH DIFFERENTIAL/PLATELET  ?MAGNESIUM  ?TROPONIN I (HIGH SENSITIVITY)  ? ? ?EKG ?None ? ?Radiology ?No results found. ? ?Procedures ?Procedures  ? ? ?Medications Ordered in ED ?Medications  ?sodium chloride 0.9 % bolus 500 mL (has no administration in time range)  ? ? ?ED Course/ Medical Decision Making/ A&P ?Clinical Course as of 05/27/21 1529  ?Wed May 27, 2021  ?1519 WBC chronically elevated per my review of her medical chart [MT]  ?  ?Clinical Course User Index ?[MT] Wyvonnia Dusky, MD  ? ?                        ?Medical Decision Making ?Amount and/or Complexity of Data Reviewed ?Labs: ordered. ?Radiology: ordered. ? ? ?This patient presents to the ED with concern for syncope versus near syncope. This involves an extensive number of treatment options, and is a complaint that carries with it a high risk of complications and morbidity.  The differential diagnosis includes vasovagal episode most likely given the description of her symptoms, versus arrhythmia versus anemia versus dehydration versus other. ? ?Additional history obtained from EMS and patient's arrival ? ?External records from outside source obtained and reviewed including cardiology outpatient work-up including carotid Dopplers, echocardiogram, telemetry monitoring in 2021. ? ?I ordered and personally interpreted labs.  The pertinent results include: No acute anemia,  chronic leukocytosis, Cr at baseline. ? ?I ordered imaging studies including dg chest ?I independently visualized and interpreted imaging which showed no focal infiltrate or PNA ?I agree with the radiologist interpretation ? ?The patient was maintained on a cardiac monitor.  I personally viewed and interpreted the cardiac monitored which showed an underlying rhythm of: NSR ? ?Per my interpretation the patient's ECG shows NSR without acute ischemic or high grade heart block ? ?I ordered medication including IV fluid bolus ?I have reviewed the patients home medicines and have made adjustments as needed ? ?Test Considered:  ?- doubt PE clinically; no tachycardia or hypoxia as I would suspect from PE large enough to cause syncope and hypotension outside the hospital.  Vitals stable here ? ? ?After the interventions noted above, I reevaluated  the patient and found that they have: improved ? ? ?Dispostion: ? ?After consideration of the diagnostic results and the patients response to treatment, I feel that the patent would benefit from reassessment after fluids, labs.  Signed out to Dr Nanda Quinton EDP at 3:15 pm pending remaining labs.  With her fairly recent echocardiogram, carotid ultrasound workup in 2021 I do not see emergent indication to repeat these scans at this time. ? ? ? ? ? ? ? ? ?Final Clinical Impression(s) / ED Diagnoses ?Final diagnoses:  ?None  ? ? ?Rx / DC Orders ?ED Discharge Orders   ? ? None  ? ?  ? ? ?  ?Wyvonnia Dusky, MD ?05/27/21 1644 ? ?

## 2021-05-29 ENCOUNTER — Telehealth: Payer: Self-pay | Admitting: Cardiovascular Disease

## 2021-05-29 NOTE — Telephone Encounter (Signed)
Dr. Gardiner Rhyme (DOD) advised of prior notation; his recommendation is for patient to continue to monitor BP and to contact clinic with any other questions or concerns. Also to forward notes to Dr. Claiborne Billings. Patient informed of this and had no questions at this time.  ?

## 2021-05-29 NOTE — Telephone Encounter (Signed)
Patient had fainting spell Wednesday and went to [employer]. She said everything checked out ok so she was just doing her due diligence to follow up with Dr. Claiborne Billings. She does not have any symptoms at this time ? ?Patient said the cause of fainting was due to a vaso vagal response ?

## 2021-05-29 NOTE — Telephone Encounter (Signed)
Patient stated she fainted on Wednesday and EMS took her to ED. She stated ED told her she had a vasovagal response. Before she fainted, she said she felt abdominal pressure, like she had to have a bowel movement. She has had at least 1 loose stool each day since Wednesday. Current BP 166/71, P 76 after climbing stairs to get cuff. Recheck showed 165/69, P 75. Denies headache , dizziness, or chest discomfort. ?

## 2021-06-02 ENCOUNTER — Encounter: Payer: Self-pay | Admitting: Cardiovascular Disease

## 2021-06-02 NOTE — Telephone Encounter (Signed)
Agree with most likely assessment.  Continue to monitor blood pressure and heart rate.  Patient is type I diabetic with insulin pump.  Recommend follow-up evaluation if recurrent symptomatology ?

## 2021-07-07 ENCOUNTER — Ambulatory Visit (INDEPENDENT_AMBULATORY_CARE_PROVIDER_SITE_OTHER): Payer: Managed Care, Other (non HMO) | Admitting: Physician Assistant

## 2021-07-07 ENCOUNTER — Ambulatory Visit: Payer: Managed Care, Other (non HMO) | Admitting: Physician Assistant

## 2021-07-07 ENCOUNTER — Encounter: Payer: Self-pay | Admitting: Physician Assistant

## 2021-07-07 VITALS — BP 156/72 | HR 88 | Ht 63.5 in | Wt 125.2 lb

## 2021-07-07 DIAGNOSIS — I447 Left bundle-branch block, unspecified: Secondary | ICD-10-CM | POA: Diagnosis not present

## 2021-07-07 DIAGNOSIS — R55 Syncope and collapse: Secondary | ICD-10-CM | POA: Diagnosis not present

## 2021-07-07 NOTE — Progress Notes (Signed)
?  ?Cardiology Office Note ? ? ?Date:  07/07/2021  ? ?ID:  Brittney [name], DOB 1961/01/19, MRN [id number] ? ?PCP:  Troy Sine, MD ?Cardiologist:  Shelva Majestic, MD 05/13/2021 ?Electrphysiologist: None ?Rosaria Ferries, PA-C  ? ?Pt here for general Cards visit ? ?History of Present Illness: ?Brittney [name] is a 61 y.o. female with a history of LBBB, presyncope, DM1, HTN, HLD, hypothyroid, SEM, nl MV 2018 ? ?Brittney [name] presents for cardiology follow up ? ?She exercises 6-7 days/week, cardio daily and wt 2-3 x week. ? ?She had an episode of near-syncope related to being outside in >100 degree heat. She went to the ER, was hydrated and improved. ? ?She was at the dentist with her daughter, went to the restroom to have a bowel movement and came out feeling bad. Sx worsened and she lost consciousness. EMS was called and she got fluids and was told it was vasovagal. Echo and other testing was ok.  ? ?Another time, she was at work and started feeling bad. She went outside and almost passed out. She had LBBB on ECG. Sx improved. She had been walking more than usual that day.  ? ?She was at work, standing and talking, started feeling bad and woke up on the floor. She had not eaten lunch, but had had breakfast. She had done her lunchtime bolus, CBG was 105 >> 82. She ate and felt better,  ? ?After her first episode of near-syncope, labs were done. Na+ was 131, chloride 91, Cr 1.11, TSH and free T4 were both normal.  ? ?After the syncope, she was having problems w/ general malaise, fatigue, and her thyroid medication was increased slightly. Those sx resolved.  ? ?She never gets chest pain.  She exercises regularly and denies any dyspnea on exertion. ? ?She does not have lower extremity edema, no orthopnea or PND. ? ?She never gets palpitations. ? ?Home BP records show SBP range 113-154, DBP range 61-74 ? ? ?Past Medical History:  ?Diagnosis Date  ? Diabetes mellitus   ? has insulin pump  ? Endocervical polyp   ? Heart murmur   ?  10 years ago  ? Hyperlipidemia   ? Hypertension   ? Hypothyroidism   ? Increased blood pressure (not hypertension)   ? Oligomenorrhea   ? Thyroid disease   ? Type 1 diabetes mellitus ([place])   ? Uterine enlargement   ? Uterine fibroid   ? ? ?Past Surgical History:  ?Procedure Laterality Date  ? CESAREAN SECTION    ? 2021  ? MYOMECTOMY ABDOMINAL APPROACH    ? 2000  ? ? ?Current Outpatient Medications  ?Medication Sig Dispense Refill  ? amLODipine (NORVASC) 5 MG tablet TAKE 1 TABLET BY MOUTH EVERY DAY 90 tablet 2  ? aspirin 81 MG tablet Take 81 mg by mouth daily.      ? benazepril-hydrochlorthiazide (LOTENSIN HCT) 10-12.5 MG per tablet Take 1 tablet by mouth Twice daily.    ? glucose blood (ONETOUCH ULTRA) test strip TEST 8 TIMES DAILY. ONE TOUCH ULTRA TEST STRIPS E10.9    ? HUMALOG 100 UNIT/ML injection Inject into the skin Continuous. Via pump    ? levothyroxine (SYNTHROID) 125 MCG tablet Take by mouth.    ? Multiple Vitamins-Minerals (MULTIVITAMIN WITH MINERALS) tablet Take 1 tablet by mouth daily.      ? ONETOUCH ULTRA test strip TEST 8 TIMES DAILY. ONE TOUCH ULTRA TEST STRIPS E10.9    ? rosuvastatin (CRESTOR) 10 MG tablet  TAKE 1 TABLET BY MOUTH EVERY DAY 90 tablet 3  ? ?No current facility-administered medications for this visit.  ? ? ?Allergies:   Patient has no known allergies.  ? ? ?Social History:  The patient  reports that she has never smoked. She has never used smokeless tobacco. She reports that she does not drink alcohol and does not use drugs.  ? ?Family History:  The patient's family history includes Colon cancer in her paternal aunt; Diabetes in her father, maternal grandfather, paternal grandfather, and sister; Hypertension in her father and sister; Hypothyroidism in her sister.  ?She indicated that her mother is alive. She indicated that her father is deceased. She indicated that her sister is alive. She indicated that her maternal grandmother is deceased. She indicated that her maternal grandfather  is deceased. She indicated that her paternal grandmother is deceased. She indicated that her paternal grandfather is deceased. She indicated that her daughter is alive. She indicated that the status of her paternal aunt is unknown. She indicated that the status of her neg hx is unknown. ? ? ?ROS:  Please see the history of present illness. All other systems are reviewed and negative.  ? ? ?PHYSICAL EXAM: ?VS:  BP (!) 156/72   Pulse 88   Ht 5' 3.5" (1.613 m)   Wt 125 lb 3.2 oz (56.8 kg)   SpO2 98%   BMI 21.83 kg/m?  , BMI Body mass index is 21.83 kg/m?. ?GEN: Well nourished, well developed, female in no acute distress ?HEENT: normal for age  ?Neck: no JVD, no carotid bruit, no masses ?Cardiac: RRR; 2/6 murmur, no rubs, or gallops ?Respiratory:  clear to auscultation bilaterally, normal work of breathing ?GI: soft, nontender, nondistended, + BS ?MS: no deformity or atrophy; no edema; distal pulses are 2+ in all 4 extremities  ?Skin: warm and dry, no rash ?Neuro:  Strength and sensation are intact ?Psych: euthymic mood, full affect ? ? ?EKG:  EKG is not ordered today. ?The ekg ordered today demonstrates n/a ? ?ECHO: 11/12/2019 ? 1. There is a small subvalvular membrane in the LVOT, no significant stenosis noted.. Left ventricular ejection fraction, by estimation, is 60 to 65%. The left ventricle has normal function. The left ventricle has no regional wall motion abnormalities.  ?Left ventricular diastolic parameters were normal.  ? 2. Right ventricular systolic function is normal. The right ventricular size is normal. There is normal pulmonary artery systolic pressure.  ? 3. Mitral valve leaflet systolic bowing without prolapse, only trivial MR. The mitral valve is normal in structure. Trivial mitral valve regurgitation.  ? 4. The aortic valve is grossly normal. Aortic valve regurgitation is not visualized. No aortic stenosis is present.  ? 5. The inferior vena cava is normal in size with <50% respiratory   ?variability, suggesting right atrial pressure of 8 mmHg.  ? ?Comparison(s): No significant change from prior study.  ? ?MONITOR: The patient was monitored for 30 days from November 12, 2019 until December 11, 2019.  The predominant rhythm was sinus rhythm with an average rate at 66 bpm.  Slowest heart rate was sinus bradycardia at 41 bpm which occurred at 1:46 AM on November 19, 2019.  The fastest heart rate was sinus tachycardia at 131 bpm which occurred on December 07, 2019 at 8:09 PM.  There was no ectopy.  There were no episodes of atrial fibrillation.  There were no pauses.  On November 12, 2019 and auto triggered event showed sinus rhythm at 78 bpm. ? ? ?  Recent Labs: ?05/27/2021: ALT 30; BUN 16; Creatinine, Ser 1.10; Hemoglobin 15.2; Magnesium 1.8; Platelets 222; Potassium 3.4; Sodium 136  ?CBC ?   ?Component Value Date/Time  ? WBC 19.9 (H) 05/27/2021 1428  ? RBC 5.04 05/27/2021 1428  ? HGB 15.2 (H) 05/27/2021 1428  ? HCT 45.7 05/27/2021 1428  ? PLT 222 05/27/2021 1428  ? MCV 90.7 05/27/2021 1428  ? MCH 30.2 05/27/2021 1428  ? MCHC 33.3 05/27/2021 1428  ? RDW 13.1 05/27/2021 1428  ? LYMPHSABS 2.3 05/27/2021 1428  ? MONOABS 1.1 (H) 05/27/2021 1428  ? EOSABS 0.1 05/27/2021 1428  ? BASOSABS 0.1 05/27/2021 1428  ? ? ?  Latest Ref Rng & Units 05/27/2021  ?  2:59 PM 05/27/2021  ?  2:28 PM 10/06/2018  ?  8:25 AM  ?CMP  ?Glucose 70 - 99 mg/dL  144   101    ?BUN 8 - 23 mg/dL  16   19    ?Creatinine 0.44 - 1.00 mg/dL  1.10   1.19    ?Sodium 135 - 145 mmol/L  136   139    ?Potassium 3.5 - 5.1 mmol/L  3.4   4.0    ?Chloride 98 - 111 mmol/L  103   97    ?CO2 22 - 32 mmol/L  21   24    ?Calcium 8.9 - 10.3 mg/dL  9.3   10.2    ?Total Protein 6.5 - 8.1 g/dL 6.1    7.1    ?Total Bilirubin 0.3 - 1.2 mg/dL 0.3    0.4    ?Alkaline Phos 38 - 126 U/L 53    76    ?AST 15 - 41 U/L 30    31    ?ALT 0 - 44 U/L 30    24    ? ? ? ?Lipid Panel ?Lab Results  ?Component Value Date  ? CHOL 169 01/11/2020  ? HDL 84 01/11/2020  ? Holt 75  01/11/2020  ? LDLDIRECT 115.1 02/03/2010  ? TRIG 49 01/11/2020  ? CHOLHDL 2.0 01/11/2020  ? ? ?  ?Wt Readings from Last 3 Encounters:  ?07/07/21 125 lb 3.2 oz (56.8 kg)  ?05/27/21 134 lb (60.8 kg)  ?05/13/21 134 lb 3.2 o

## 2021-07-07 NOTE — Patient Instructions (Signed)
Medication Instructions:  ?Your physician recommends that you continue on your current medications as directed. Please refer to the Current Medication list given to you today.  ? ?*If you need a refill on your cardiac medications before your next appointment, please call your pharmacy* ? ? ?Lab Work: ?NONE ordered at this time of appointment  ? ?If you have labs (blood work) drawn today and your tests are completely normal, you will receive your results only by: ?MyChart Message (if you have MyChart) OR ?A paper copy in the mail ?If you have any lab test that is abnormal or we need to change your treatment, we will call you to review the results. ? ? ?Testing/Procedures: ?NONE ordered at this time of appointment  ? ? ? ?Follow-Up: ?At [facility], you and your health needs are our priority.  As part of our continuing mission to provide you with exceptional heart care, we have created designated Provider Care Teams.  These Care Teams include your primary Cardiologist (physician) and Advanced Practice Providers (APPs -  Physician Assistants and Nurse Practitioners) who all work together to provide you with the care you need, when you need it. ? ?We recommend signing up for the patient portal called "MyChart".  Sign up information is provided on this After Visit Summary.  MyChart is used to connect with patients for Virtual Visits (Telemedicine).  Patients are able to view lab/test results, encounter notes, upcoming appointments, etc.  Non-urgent messages can be sent to your provider as well.   ?To learn more about what you can do with MyChart, go to [email or web address].   ? ?Your next appointment:   ?11 month(s) ? ?The format for your next appointment:   ?In Person ? ?Provider:   ?Shelva Majestic, MD   ? ? ?Other Instructions ? ? ?Important Information About Sugar ? ? ? ? ? ? ?

## 2021-07-09 ENCOUNTER — Other Ambulatory Visit: Payer: Self-pay | Admitting: Cardiovascular Disease

## 2022-03-20 ENCOUNTER — Other Ambulatory Visit: Payer: Self-pay | Admitting: Cardiovascular Disease

## 2022-05-14 ENCOUNTER — Ambulatory Visit (HOSPITAL_COMMUNITY): Payer: Managed Care, Other (non HMO) | Attending: Cardiovascular Disease

## 2022-05-14 DIAGNOSIS — I10 Essential (primary) hypertension: Secondary | ICD-10-CM | POA: Diagnosis present

## 2022-05-14 DIAGNOSIS — I340 Nonrheumatic mitral (valve) insufficiency: Secondary | ICD-10-CM | POA: Insufficient documentation

## 2022-05-14 LAB — ECHOCARDIOGRAM COMPLETE
Area-P 1/2: 4.29 cm2
S' Lateral: 3 cm

## 2022-05-20 ENCOUNTER — Ambulatory Visit: Payer: Managed Care, Other (non HMO) | Attending: Cardiovascular Disease | Admitting: Cardiovascular Disease

## 2022-05-20 ENCOUNTER — Encounter: Payer: Self-pay | Admitting: Cardiovascular Disease

## 2022-05-20 DIAGNOSIS — E039 Hypothyroidism, unspecified: Secondary | ICD-10-CM

## 2022-05-20 DIAGNOSIS — E785 Hyperlipidemia, unspecified: Secondary | ICD-10-CM

## 2022-05-20 DIAGNOSIS — E109 Type 1 diabetes mellitus without complications: Secondary | ICD-10-CM

## 2022-05-20 DIAGNOSIS — I340 Nonrheumatic mitral (valve) insufficiency: Secondary | ICD-10-CM | POA: Diagnosis not present

## 2022-05-20 DIAGNOSIS — I10 Essential (primary) hypertension: Secondary | ICD-10-CM | POA: Diagnosis not present

## 2022-05-20 DIAGNOSIS — I447 Left bundle-branch block, unspecified: Secondary | ICD-10-CM

## 2022-05-20 NOTE — Patient Instructions (Signed)
Medication Instructions:  No changes   *If you need a refill on your cardiac medications before your next appointment, please call your pharmacy*   Lab Work: CBC Cmp Lipid LPa If you have labs (blood work) drawn today and your tests are completely normal, you will receive your results only by: [place] (if you have MyChart) OR A paper copy in the mail If you have any lab test that is abnormal or we need to change your treatment, we will call you to review the results.   Testing/Procedures: Not needed   Follow-Up: At [facility], you and your health needs are our priority.  As part of our continuing mission to provide you with exceptional heart care, we have created designated Provider Care Teams.  These Care Teams include your primary Cardiologist (physician) and Advanced Practice Providers (APPs -  Physician Assistants and Nurse Practitioners) who all work together to provide you with the care you need, when you need it.     Your next appointment:   12 month(s)  The format for your next appointment:   In Person  Provider:   Shelva Majestic, MD

## 2022-05-20 NOTE — Progress Notes (Signed)
Cardiology Office Note    Date:  05/25/2022   ID:  Brittney [name], DOB [birth year], MRN [id number]  PCP:  Dr. Kendall Flack Cardiologist:  Shelva Majestic, MD    12 month F/U  History of Present Illness:  Brittney [name] is a 62 y.o. female who was initially referred by Dr. Davonna Belling following emergency room evaluation for presyncope where she was found to have left bundle branch block. I saw her for initial evaluation on 06/28/2016 and last evaluated her in March 2023.  She presents for follow-up evaluation.    Brittney [name] has a history of type 1 diabetes mellitus since age 75.  She is followed very closely by Dr. Joneen Roach in endocrinology at [facility].  In 1999, she was started on an insulin pump. She has a history of hypertension and was started on medication approximately  20 years years ago.  She has been taking, benazapril/HCT 10/12.5 twice a day.  She has a history of hyperlipidemia and initially was started on pravastatin 20 mg daily.  She has a history of hypothyroidism for which she has been taking Synthroid 112 g daily.  She denies any awareness of chest pain, PND, orthopnea.  She is unaware of palpitations.  On 06/17/2016 she developed a near syncopal episode while at work.  Apparently, she had just walked through a patch of bugs and then started to itch all over.  She suddenly became lightheaded, but did not lose consciousness or fall.  Due to persistent symptoms, EMS was called.  Her CBG was 90.  She has an insulin pump and had eaten breakfast, but had not had lunch.  She had a similar episode in 2011 which was attributed to a vasovagal episode.  In the ER her blood pressure was 131/61.  Her pulse was 60.  She was not anemic, but white count was 17.8.  A bmet was normal with a creatinine of 1.24, glucose 148.  Her ECG showed sinus rhythm with a new left bundle branch block from her 2011 ECG.  In the emergency room, she had a negative workup.  A chest x-ray was negative.  Troponin was  negative.  Because of her new left bundle branch block, she is now referred for cardiology consultation and evaluation.  Upon arrival to the emergency room, she denied fevers, chills, headache, visual changes, cough, shortness of breath, chest pain, palpitations, abdominal pain, nausea, or vomiting.  She denied any paresthesias.  While in the emergency room, her blood pressure was 131/61.  Her pulse was 60.  She had negative orthostatics.  Her CBG was 178.  Laboratory revealed an elevated white blood count at 17.8 with a normal hemoglobin and hematocrit.  Cr was 1.24 with an anion gap of 17, which was felt possibly due to mild dehydration.  Her chest x-ray was negative.  Troponin was negative.  Her ECG showed sinus rhythm but there was a new left bundle branch block.  Compared to her prior ECG of 2011.  Because of this new left bundle branch block.  She was  referred for cardiology consultation and evaluation.  When I saw her for my initial evaluation, she was hypertensive and recommended initiation of low-dose amlodipine at 2.5 mg daily for more optimal blood pressure control.  She had a 2/6 systolic murmur left sternal border and is scheduled her for an echo Doppler study.  This demonstrated normal LV function with mild grade 1 diastolic dysfunction.  The left ventricle outflow tract below  the aortic valve.  There was a subvalvular calcified ridge which created turbulence resulting in a mean gradient of 11.  There was no valvular aortic stenosis.  She underwent repeat laboratory which revealed a total cholesterol 181, triglycerides 58, HDL 83, and LDL 86.  With her long-standing diabetes mellitus, history I referred her for nuclear stress test.  This revealed normal perfusion with an ejection fraction of 58% without evidence for scar or ischemia.   She was evaluated in January 2019 by Jory Sims, NP and remained fairly stable.  She underwent a follow-up echo Doppler study in July 2019 which continue to  show normal systolic function with an EF of 55 to 60%.  There was systolic bowing of her mitral valve without prolapse with mild MR directed eccentrically and posteriorly.  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  I saw her on April 26, 2018. She is monitored by Dr. Joneen Roach at [facility] at six-month intervals for her type 1 diabetes mellitus.  She brought with her complete set of laboratory from March 28, 2018.  Of note, her lipids had increased and her total cholesterol was 227 with an LDL cholesterol of 126, HDL cholesterol at 87 and triglycerides at 70.  Laboratory was otherwise normal except there was very mild urine microalbumin.  She continues to be on pravastatin at 20 mg amlodipine 5 mg, benazepril/HCTZ 10/12.5 mg in addition to her levothyroxine 112 mcg and Humalog insulin via pump.  She denies chest pain, PND, orthopnea, palpitations and presyncope or syncope.    I evaluated her in a telemedicine visit on Jul 19, 2019.  Her blood pressure was controlled on benazepril HCT 10/12.5.  She was tolerating rosuvastatin which I had changed from pravastatin previously.  She continues to be on levothyroxine for hypothyroidism.  I last saw her on May 13, 2021 at which time she remained cardiac stable.  She has undergone laboratory at [facility]  She specifically denies chest pain shortness of breath or dizziness.  Hemoglobin A1c was 7.4.  She continues to be on amlodipine 5 mg, benazepril HCT 10/12.5 mg, levothyroxine 125 mcg in addition to rosuvastatin 10 mg.  She is on Humalog insulin in the insulin pump.  During that evaluation, I recommended that in 2024 she undergo a 5-year follow-up echo Doppler evaluation.  Over the past year, Brittney [name] has remained stable.  Unfortunately Dr. Berton Lan is now deceased and she is followed by Dr. Reesa Chew at [facility] for her diabetes.  On May 14, 2022 she underwent 5-year follow-up echo Doppler study which showed EF at 50 to 55%.  There were no wall motion abnormalities.  She had normal diastolic parameters.  There was mild mitral valve regurgitation.  She had normal pulmonary artery systolic pressure.  Aortic and tricuspid valves were normal.  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  She has not had recent laboratory.  She presents for evaluation.   Past Medical History:  Diagnosis Date   Diabetes mellitus    has insulin pump   Endocervical polyp    Heart murmur    10 years ago   Hyperlipidemia    Hypertension    Hypothyroidism    Increased blood pressure (not hypertension)    Oligomenorrhea    Thyroid disease    Type 1 diabetes mellitus (HCC)    Uterine enlargement  Uterine fibroid     Past Surgical History:  Procedure Laterality Date   CESAREAN SECTION     2021   MYOMECTOMY ABDOMINAL APPROACH     2000    Current Medications: Outpatient Medications Prior to Visit  Medication Sig Dispense Refill   amLODipine (NORVASC) 5 MG tablet TAKE 1 TABLET BY MOUTH EVERY DAY 90 tablet 0   aspirin 81 MG tablet Take 81 mg by mouth daily.       benazepril-hydrochlorthiazide (LOTENSIN HCT) 10-12.5 MG per tablet Take 1 tablet by mouth Twice daily.     glucose blood (ONETOUCH ULTRA) test strip TEST 8 TIMES DAILY. ONE TOUCH ULTRA TEST STRIPS E10.9     levothyroxine (SYNTHROID) 100 MCG tablet Take 100 mcg by mouth daily before breakfast.     Multiple Vitamins-Minerals (MULTIVITAMIN WITH MINERALS) tablet Take 1 tablet by mouth daily.       ONETOUCH ULTRA test strip TEST 8 TIMES DAILY. ONE TOUCH ULTRA TEST STRIPS E10.9     rosuvastatin (CRESTOR) 10 MG tablet TAKE 1 TABLET BY MOUTH EVERY DAY 90 tablet 3   HUMALOG 100 UNIT/ML injection Inject into the skin Continuous. Via pump     levothyroxine (SYNTHROID) 125 MCG tablet Take by mouth.     No facility-administered medications prior to visit.      Allergies:   Patient has no known allergies.   Social History   Socioeconomic History   Marital status: Married    Spouse name: Not on file   Number of children: Not on file   Years of education: Not on file   Highest education level: Not on file  Occupational History   Not on file  Tobacco Use   Smoking status: Never   Smokeless tobacco: Never  Vaping Use   Vaping Use: Never used  Substance and Sexual Activity   Alcohol use: No   Drug use: No   Sexual activity: Yes    Birth control/protection: Post-menopausal  Other Topics Concern   Not on file  Social History Narrative   Not on file   Social Determinants of Health   Financial Resource Strain: Not on file  Food Insecurity: Not on file  Transportation Needs: Not on file  Physical Activity: Not on file  Stress: Not on file  Social Connections: Not on file    Socially, she is the Museum/gallery curator at [employer].  She has a Scientist, water quality.  There is no tobacco history.  She very rarely has a glass of wine.  She does exercise daily, doing elliptical, running or walking 7 days per week for at least 30 minutes a session.  She also uses weight machine 2 days per week.  Family History:  The patient's family history includes Colon cancer in her paternal aunt; Diabetes in her father, maternal grandfather, paternal grandfather, and sister; Hypertension in her father and sister; Hypothyroidism in her sister.  .  She has a twin sister who also has type 1 diabetes mellitus, hypertension and hypothyroidism.  Her father died at age 33 and had diabetes, hypertension, and suffered a heart attack at age 47.  She has a daughter age 14.  ROS General: Negative; No fevers, chills, or night sweats;  HEENT: Negative; No changes in vision or hearing, sinus congestion, difficulty swallowing Pulmonary: Negative; No cough, wheezing, shortness of breath, hemoptysis Cardiovascular: no episodes of dizziness or chest pain. GI: Negative; No  nausea, vomiting, diarrhea, or abdominal pain GU: Negative; No dysuria, hematuria, or difficulty voiding Musculoskeletal:  Negative; no myalgias, joint pain, or weakness Hematologic/Oncology: Negative; no easy bruising, bleeding Endocrine: Type 1 diabetes mellitus Neuro: Negative; no changes in balance, headaches Skin: Negative; No rashes or skin lesions Psychiatric: Negative; No behavioral problems, depression Sleep: Negative; No snoring, daytime sleepiness, hypersomnolence, bruxism, restless legs, hypnogognic hallucinations, no cataplexy Other comprehensive 14 point system review is negative.   PHYSICAL EXAM:   VS:  BP (!) 144/70   Pulse 74   Ht 5' 3.5" (1.613 m)   Wt 127 lb 12.8 oz (58 kg)   SpO2 99%   BMI 22.28 kg/m     Repeat blood pressure by me was 130/70  Wt Readings from Last 3 Encounters:  05/20/22 127 lb 12.8 oz (58 kg)  07/07/21 125 lb 3.2 oz (56.8 kg)  05/27/21 134 lb (60.8 kg)    General: Alert, oriented, no distress.  Skin: normal turgor, no rashes, warm and dry HEENT: Normocephalic, atraumatic. Pupils equal round and reactive to light; sclera anicteric; extraocular muscles intact;  Nose without nasal septal hypertrophy Mouth/Parynx benign; Mallinpatti scale 3 Neck: No JVD, no carotid bruits; normal carotid upstroke Lungs: clear to ausculatation and percussion; no wheezing or rales Chest wall: without tenderness to palpitation Heart: PMI not displaced, RRR, s1 s2 normal, [id number] systolic murmur, no diastolic murmur, no rubs, gallops, thrills, or heaves Abdomen: soft, nontender; no hepatosplenomehaly, BS+; abdominal aorta nontender and not dilated by palpation. Back: no CVA tenderness Pulses 2+ Musculoskeletal: full range of motion, normal strength, no joint deformities Extremities: no clubbing cyanosis or edema, Homan's sign negative  Neurologic: grossly nonfocal; Cranial nerves grossly wnl Psychologic: Normal mood and affect  Studies/Labs Reviewed:   May 20, 2022 ECG (independently read by me): NSR at 74, PRWP V1-3, LBBB                                                                     May 13, 2021 ECG (independently read by me): Normal sinus rhythm at 78 bpm.  Incomplete left bundle branch block.  Left axis deviation.  QTc interval 474 ms.  April 26, 2018 ECG (independently read by me): Normal sinus rhythm at 81 bpm.  Left bundle branch block with repolarization changes.  QTc interval 492 ms.  Jun 28 2016 ECG (independently read by me):  Normal sinus rhythm at 77 bpm.  Left bundle branch block with repolarization changes.  PR interval 148 ms; QTc interval 454 ms.  Recent Labs:    Latest Ref Rng & Units 05/27/2021    2:28 PM 10/06/2018    8:25 AM 02/07/2017    8:18 AM  BMP  Glucose 70 - 99 mg/dL 144  101  135   BUN 8 - 23 mg/dL 16  19  26    Creatinine 0.44 - 1.00 mg/dL 1.10  1.19  1.33   BUN/Creat Ratio 9 - 23  16  20    Sodium 135 - 145 mmol/L 136  139  139   Potassium 3.5 - 5.1 mmol/L 3.4  4.0  4.2   Chloride 98 - 111 mmol/L 103  97  99   CO2 22 - 32 mmol/L 21  24  25    Calcium 8.9 - 10.3 mg/dL 9.3  10.2  10.2  Latest Ref Rng & Units 05/27/2021    2:59 PM 10/06/2018    8:25 AM 02/07/2017    8:18 AM  Hepatic Function  Total Protein 6.5 - 8.1 g/dL 6.1  7.1  7.2   Albumin 3.5 - 5.0 g/dL 3.7  4.9  4.7   AST 15 - 41 U/L 30  31  32   ALT 0 - 44 U/L 30  24  32   Alk Phosphatase 38 - 126 U/L 53  76  79   Total Bilirubin 0.3 - 1.2 mg/dL 0.3  0.4  0.4   Bilirubin, Direct 0.0 - 0.2 mg/dL <0.1          Latest Ref Rng & Units 05/27/2021    2:28 PM 06/17/2016    2:26 PM 11/14/2009    5:00 AM  CBC  WBC 4.0 - 10.5 K/uL 19.9  17.8  8.4   Hemoglobin 12.0 - 15.0 g/dL 15.2  16.2  12.9   Hematocrit 36.0 - 46.0 % 45.7  47.1  37.4   Platelets 150 - 400 K/uL 222  217  177    Lab Results  Component Value Date   MCV 90.7 05/27/2021   MCV 88.9 06/17/2016   MCV 88.0 11/14/2009   Lab Results  Component Value Date   TSH 1.480  02/07/2017   No results found for: "HGBA1C"   BNP No results found for: "BNP"  ProBNP No results found for: "PROBNP"   Lipid Panel     Component Value Date/Time   CHOL 169 01/11/2020 0808   TRIG 49 01/11/2020 0808   HDL 84 01/11/2020 0808   CHOLHDL 2.0 01/11/2020 0808   CHOLHDL 2.2 06/21/2016 0815   VLDL 12 06/21/2016 0815   LDLCALC 75 01/11/2020 0808   LDLDIRECT 115.1 02/03/2010 0835     RADIOLOGY: ECHOCARDIOGRAM COMPLETE  Result Date: 05/14/2022    ECHOCARDIOGRAM REPORT   Patient Name:   ALLEN COHENOUR  Date of Exam: 05/14/2022 Medical Rec #:  [id number]     Height:       63.5 in Accession #:    [id number]    Weight:       125.2 lb Date of Birth:  [birth year]      BSA:          1.594 m Patient Age:    52 years      BP:           146/64 mmHg Patient Gender: F             HR:           81 bpm. Exam Location:  [address] Procedure: 2D Echo, 3D Echo, Cardiac Doppler and Color Doppler Indications:    I10 Hypertension  History:        Patient has prior history of Echocardiogram examinations, most                 recent 11/12/2019. Arrythmias:LBBB, Signs/Symptoms:Murmur and                 Syncope; Risk Factors:Diabetes and Dyslipidemia. Hypothyroidism.  Sonographer:    Diamond Nickel RCS Referring Phys: [place]  1. Left ventricular ejection fraction, by estimation, is 50 to 55%. The left ventricle has low normal function. The left ventricle has no regional wall motion abnormalities. Left ventricular diastolic parameters were normal.  2. Right ventricular systolic function is normal. The right ventricular size is normal. There is normal pulmonary artery systolic  pressure.  3. The mitral valve is normal in structure. Mild mitral valve regurgitation. No evidence of mitral stenosis.  4. The aortic valve is normal in structure. Aortic valve regurgitation is trivial. No aortic stenosis is present.  5. The inferior vena cava is normal in size with greater than 50% respiratory  variability, suggesting right atrial pressure of 3 mmHg. FINDINGS  Left Ventricle: Left ventricular ejection fraction, by estimation, is 50 to 55%. The left ventricle has low normal function. The left ventricle has no regional wall motion abnormalities. The left ventricular internal cavity size was normal in size. There is no left ventricular hypertrophy. Left ventricular diastolic parameters were normal. Indeterminate filling pressures. Right Ventricle: The right ventricular size is normal. No increase in right ventricular wall thickness. Right ventricular systolic function is normal. There is normal pulmonary artery systolic pressure. The tricuspid regurgitant velocity is 1.49 m/s, and  with an assumed right atrial pressure of 3 mmHg, the estimated right ventricular systolic pressure is [id number] mmHg. Left Atrium: Left atrial size was normal in size. Right Atrium: Right atrial size was normal in size. Pericardium: There is no evidence of pericardial effusion. Mitral Valve: The mitral valve is normal in structure. Mild mitral valve regurgitation. No evidence of mitral valve stenosis. Tricuspid Valve: The tricuspid valve is normal in structure. Tricuspid valve regurgitation is trivial. No evidence of tricuspid stenosis. Aortic Valve: The aortic valve is normal in structure. Aortic valve regurgitation is trivial. No aortic stenosis is present. Pulmonic Valve: The pulmonic valve was normal in structure. Pulmonic valve regurgitation is not visualized. No evidence of pulmonic stenosis. Aorta: The aortic root is normal in size and structure. Venous: The inferior vena cava is normal in size with greater than 50% respiratory variability, suggesting right atrial pressure of 3 mmHg. IAS/Shunts: No atrial level shunt detected by color flow Doppler.  LEFT VENTRICLE PLAX 2D LVIDd:         4.20 cm   Diastology LVIDs:         3.00 cm   LV e' medial:    9.03 cm/s LV PW:         0.80 cm   LV E/e' medial:  11.8 LV IVS:        0.70 cm    LV e' lateral:   12.60 cm/s LVOT diam:     1.90 cm   LV E/e' lateral: 8.5 LV SV:         57 LV SV Index:   36 LVOT Area:     2.84 cm                           3D Volume EF:                          3D EF:        54 %                          LV EDV:       121 ml                          LV ESV:       55 ml                          LV SV:  65 ml RIGHT VENTRICLE RV Basal diam:  2.70 cm RV S prime:     15.10 cm/s TAPSE (M-mode): 2.7 cm LEFT ATRIUM             Index        RIGHT ATRIUM          Index LA diam:        2.80 cm 1.76 cm/m   RA Area:     8.12 cm LA Vol (A2C):   30.1 ml 18.88 ml/m  RA Volume:   14.90 ml 9.35 ml/m LA Vol (A4C):   34.6 ml 21.71 ml/m LA Biplane Vol: 32.7 ml 20.52 ml/m  AORTIC VALVE LVOT Vmax:   89.40 cm/s LVOT Vmean:  56.200 cm/s LVOT VTI:    0.201 m  AORTA Ao Root diam: 2.30 cm Ao Asc diam:  2.80 cm MITRAL VALVE                TRICUSPID VALVE MV Area (PHT): 4.29 cm     TR Peak grad:   8.9 mmHg MV Decel Time: 177 msec     TR Vmax:        149.00 cm/s MV E velocity: 107.00 cm/s MV A velocity: 99.60 cm/s   SHUNTS MV E/A ratio:  1.07         Systemic VTI:  0.20 m                             Systemic Diam: 1.90 cm Skeet Latch MD Electronically signed by Skeet Latch MD Signature Date/Time: 05/14/2022/11:39:37 AM    Final      Additional studies/ records that were reviewed today include:  I reviewed the patient's ER evaluation in detail as well as laboratory. Since her initial visit, I reviewed an echo Doppler study from 11/14/2009 as well as her most recent echo, nuclear stress test, and laboratory.  I reviewed the laboratory from March 28, 2018 done at [facility].   ASSESSMENT:    1. Essential hypertension   2. LBBB (left bundle branch block)   3. Hyperlipidemia with target LDL less than 70   4. Nonrheumatic mitral valve regurgitation   5. Type 1 diabetes mellitus without complication (HCC)   6. Acquired hypothyroidism      PLAN:  Brittney [name] is a  62 year old female who has a history of type 1 diabetes mellitus since age 50 and has been on insulin pump since 1999.  She has a longstanding history of hypertension, hyperlipidemia, and hypothyroidism.  When I initially saw her in 2018 she had recently developed  an episode of lightheadedness which seemed to occur after she had inadvertently walked through a nest of bugs.  She noticed itching and pruritus in the possibility of transient associated vasodilation in etiology of her event.  At the time of her evaluation, she was not hypoglycemic and she did not have orthostatic hypotension.  She was found to have a new left bundle branch block which was new compared to her 2011 ECG.when I initially saw her, she was hypertensive and I recommended initial therapy with amlodipine and started her in a very low dose of 2.5 mg.  When I saw her in follow-up several weeks later amlodipine was titrated to 5 mg for more optimal blood pressure control.  Her echo Doppler study has shown mild mitral valve systolic bowing without definitive prolapse with eccentrically directed mitral regurgitation.  She has documented nonobstructive  subvalvular ridge in the LVOT track which has not changed on her  echo of 2019 from her prior study.  Presently, she feels well and is asymptomatic.  Her blood pressure has been controlled on her current regimen of amlodipine 5 mg, benazepril HCT 10/12.5 mg, and she continues to be on levothyroxine 100 mcg for hypothyroidism and her insulin pump.  She is on rosuvastatin 10 mg for hyperlipidemia.  She has not had recent laboratory and I will have recommended we obtain a comprehensive metabolic panel, CBC, fasting lipid panel and will also assess LP(a).  She will be seeing Dr. Nelida Meuse at [facility] for endocrinology who will be following her thyroid and diabetic regimen.  I reviewed her most recent echo Doppler study which showed EF at 50 to 55%.  There were no wall motion abnormalities and she had normal  diastolic parameters.  There was mild mitral regurgitation.  On this echo there was no mention of her previous nonobstructive subvalvular ridge in the LVOT track noted on prior studies.  She remains asymptomatic.  She will continue current medical regimen.  Notified her regarding laboratory results and adjustments will be made if necessary.  I will see her in 1 year for reevaluation or sooner as needed.  Medication Adjustments/Labs and Tests Ordered: Current medicines are reviewed at length with the patient today.  Concerns regarding medicines are outlined above.  Medication changes, Labs and Tests ordered today are listed in the Patient Instructions below.  Patient Instructions  Medication Instructions:  No changes   *If you need a refill on your cardiac medications before your next appointment, please call your pharmacy*   Lab Work: CBC Cmp Lipid LPa If you have labs (blood work) drawn today and your tests are completely normal, you will receive your results only by: House (if you have MyChart) OR A paper copy in the mail If you have any lab test that is abnormal or we need to change your treatment, we will call you to review the results.   Testing/Procedures: Not needed   Follow-Up: At [facility], you and your health needs are our priority.  As part of our continuing mission to provide you with exceptional heart care, we have created designated Provider Care Teams.  These Care Teams include your primary Cardiologist (physician) and Advanced Practice Providers (APPs -  Physician Assistants and Nurse Practitioners) who all work together to provide you with the care you need, when you need it.     Your next appointment:   12 month(s)  The format for your next appointment:   In Person  Provider:   Shelva Majestic, MD     Signed, Shelva Majestic, MD, [facility]  05/25/2022 10:47 AM    [place] [address], [place], [place], [place]   [address] Phone: [phone]

## 2022-05-25 ENCOUNTER — Encounter: Payer: Self-pay | Admitting: Cardiovascular Disease

## 2022-05-28 LAB — COMPREHENSIVE METABOLIC PANEL
ALT: 25 IU/L (ref 0–32)
AST: 29 IU/L (ref 0–40)
Albumin/Globulin Ratio: 1.9 (ref 1.2–2.2)
Albumin: 4.7 g/dL (ref 3.9–4.9)
Alkaline Phosphatase: 73 IU/L (ref 44–121)
BUN/Creatinine Ratio: 15 (ref 12–28)
BUN: 16 mg/dL (ref 8–27)
Bilirubin Total: 0.6 mg/dL (ref 0.0–1.2)
CO2: 22 mmol/L (ref 20–29)
Calcium: 9.9 mg/dL (ref 8.7–10.3)
Chloride: 94 mmol/L — ABNORMAL LOW (ref 96–106)
Creatinine, Ser: 1.1 mg/dL — ABNORMAL HIGH (ref 0.57–1.00)
Globulin, Total: 2.5 g/dL (ref 1.5–4.5)
Glucose: 214 mg/dL — ABNORMAL HIGH (ref 70–99)
Potassium: 4.8 mmol/L (ref 3.5–5.2)
Sodium: 133 mmol/L — ABNORMAL LOW (ref 134–144)
Total Protein: 7.2 g/dL (ref 6.0–8.5)
eGFR: 57 mL/min/{1.73_m2} — ABNORMAL LOW (ref >59)

## 2022-05-28 LAB — CBC
Hematocrit: 42.5 % (ref 34.0–46.6)
Hemoglobin: 14.6 g/dL (ref 11.1–15.9)
MCH: 31.1 pg (ref 26.6–33.0)
MCHC: 34.4 g/dL (ref 31.5–35.7)
MCV: 91 fL (ref 79–97)
Platelets: 230 10*3/uL (ref 150–450)
RBC: 4.69 x10E6/uL (ref 3.77–5.28)
RDW: 13.3 % (ref 11.7–15.4)
WBC: 5.9 10*3/uL (ref 3.4–10.8)

## 2022-05-28 LAB — LIPID PANEL
Chol/HDL Ratio: 2 ratio (ref 0.0–4.4)
Cholesterol, Total: 175 mg/dL (ref 100–199)
HDL: 89 mg/dL (ref >39)
LDL Chol Calc (NIH): 73 mg/dL (ref 0–99)
Triglycerides: 67 mg/dL (ref 0–149)
VLDL Cholesterol Cal: 13 mg/dL (ref 5–40)

## 2022-05-28 LAB — LIPOPROTEIN A (LPA): Lipoprotein (a): 9.1 nmol/L (ref <75.0)

## 2022-06-17 ENCOUNTER — Other Ambulatory Visit: Payer: Self-pay | Admitting: Cardiovascular Disease

## 2023-05-26 ENCOUNTER — Ambulatory Visit: Payer: Managed Care, Other (non HMO) | Attending: Cardiovascular Disease | Admitting: Cardiovascular Disease

## 2023-05-26 ENCOUNTER — Encounter: Payer: Self-pay | Admitting: Cardiovascular Disease

## 2023-05-26 VITALS — BP 142/78 | HR 69 | Ht 63.5 in | Wt 129.0 lb

## 2023-05-26 DIAGNOSIS — E785 Hyperlipidemia, unspecified: Secondary | ICD-10-CM

## 2023-05-26 DIAGNOSIS — E109 Type 1 diabetes mellitus without complications: Secondary | ICD-10-CM | POA: Diagnosis not present

## 2023-05-26 DIAGNOSIS — I10 Essential (primary) hypertension: Secondary | ICD-10-CM

## 2023-05-26 DIAGNOSIS — I340 Nonrheumatic mitral (valve) insufficiency: Secondary | ICD-10-CM

## 2023-05-26 DIAGNOSIS — I447 Left bundle-branch block, unspecified: Secondary | ICD-10-CM

## 2023-05-26 DIAGNOSIS — E039 Hypothyroidism, unspecified: Secondary | ICD-10-CM

## 2023-05-26 NOTE — Patient Instructions (Signed)
 Medication Instructions:  No medication changes were made during today's visit.  *If you need a refill on your cardiac medications before your next appointment, please call your pharmacy*   Lab Work: No labs were ordered during today's visit.  If you have labs (blood work) drawn today and your tests are completely normal, you will receive your results only by: MyChart Message (if you have MyChart) OR A paper copy in the mail If you have any lab test that is abnormal or we need to change your treatment, we will call you to review the results.   Testing/Procedures: No procedures were ordered during today's visit.    Follow-Up: At [facility], you and your health needs are our priority.  As part of our continuing mission to provide you with exceptional heart care, we have created designated Provider Care Teams.  These Care Teams include your primary Cardiologist (physician) and Advanced Practice Providers (APPs -  Physician Assistants and Nurse Practitioners) who all work together to provide you with the care you need, when you need it.  We recommend signing up for the patient portal called "MyChart".  Sign up information is provided on this After Visit Summary.  MyChart is used to connect with patients for Virtual Visits (Telemedicine).  Patients are able to view lab/test results, encounter notes, upcoming appointments, etc.  Non-urgent messages can be sent to your provider as well.   To learn more about what you can do with MyChart, go to [email or web address].    Your next appointment:   1 year(s)  Provider:   Dr. Epifanio Lesches     Other Instructions [facility]  [address] [place], [place] [address] OPENING APRIL [phone]       1st Floor: - Lobby - Registration  - Pharmacy  - Lab - Cafe   2nd Floor: - PV Lab - Diagnostic Testing (echo, CT, nuclear med)   3rd Floor: - Vacant   4th Floor: - TCTS (cardiothoracic  surgery) - AFib Clinic - Structural Heart Clinic - Vascular Surgery  - Vascular Ultrasound   5th Floor: - [facility] (general and EP) - Clinical Pharmacy for coumadin, hypertension, lipid, weight-loss medications, and med management appointments      Valet parking services will be available as well.       Thank you for choosing [facility]!     A letter will be mailed to you as a reminder to call the office for your next follow up appointment.

## 2023-05-26 NOTE — Progress Notes (Signed)
 Cardiology Office Note    Date:  05/29/2023   ID:  Brittney [name], DOB 09/11/1960, MRN [id number]  PCP:  Dr. Dierdre [name] Cardiologist:  Brittney Guadalajara, MD   12 month F/U  History of Present Illness:  Brittney [name] is a 63 y.o. female who was initially referred by Dr. Benjiman [name] following emergency room evaluation for presyncope where she was found to have left bundle branch block. I saw her for initial evaluation on 06/28/2016 and last evaluated her in March 2024.  She presents for follow-up evaluation.    [name] has a history of type 1 diabetes mellitus since age 1.  She was followed very closely by Dr. Ferd [name] in endocrinology at [facility].  In 1999, she was started on an insulin pump. She has a history of hypertension and was started on medication approximately  20 years years ago.  She has been taking, benazapril/HCT 10/12.5 twice a day.  She has a history of hyperlipidemia and initially was started on pravastatin 20 mg daily.  She has a history of hypothyroidism for which she has been taking Synthroid 112 g daily.  She denies any awareness of chest pain, PND, orthopnea.  She is unaware of palpitations.  On 06/17/2016 she developed a near syncopal episode while at work.  Apparently, she had just walked through a patch of bugs and then started to itch all over.  She suddenly became lightheaded, but did not lose consciousness or fall.  Due to persistent symptoms, EMS was called.  Her CBG was 90.  She has an insulin pump and had eaten breakfast, but had not had lunch.  She had a similar episode in 2011 which was attributed to a vasovagal episode.  In the ER her blood pressure was 131/61.  Her pulse was 60.  She was not anemic, but white count was 17.8.  A bmet was normal with a creatinine of 1.24, glucose 148.  Her ECG showed sinus rhythm with a new left bundle branch block from her 2011 ECG.  In the emergency room, she had a negative workup.  A chest x-ray was negative.  Troponin was  negative.  Because of her new left bundle branch block, she is now referred for cardiology consultation and evaluation.  Upon arrival to the emergency room, she denied fevers, chills, headache, visual changes, cough, shortness of breath, chest pain, palpitations, abdominal pain, nausea, or vomiting.  She denied any paresthesias.  While in the emergency room, her blood pressure was 131/61.  Her pulse was 60.  She had negative orthostatics.  Her CBG was 178.  Laboratory revealed an elevated white blood count at 17.8 with a normal hemoglobin and hematocrit.  Cr was 1.24 with an anion gap of 17, which was felt possibly due to mild dehydration.  Her chest x-ray was negative.  Troponin was negative.  Her ECG showed sinus rhythm but there was a new left bundle branch block.  Compared to her prior ECG of 2011.  Because of this new left bundle branch block.  She was  referred for cardiology consultation and evaluation.  When I saw her for my initial evaluation, she was hypertensive and recommended initiation of low-dose amlodipine at 2.5 mg daily for more optimal blood pressure control.  She had a 2/6 systolic murmur left sternal border and is scheduled her for an echo Doppler study.  This demonstrated normal LV function with mild grade 1 diastolic dysfunction.  The left ventricle outflow tract below the  aortic valve.  There was a subvalvular calcified ridge which created turbulence resulting in a mean gradient of 11.  There was no valvular aortic stenosis.  She underwent repeat laboratory which revealed a total cholesterol 181, triglycerides 58, HDL 83, and LDL 86.  With her long-standing diabetes mellitus, history I referred her for nuclear stress test.  This revealed normal perfusion with an ejection fraction of 58% without evidence for scar or ischemia.   She was evaluated in January 2019 by Brittney Reining, NP and remained fairly stable.  She underwent a follow-up echo Doppler study in July 2019 which continue to  show normal systolic function with an EF of 55 to 60%.  There was systolic bowing of her mitral valve without prolapse with mild MR directed eccentrically and posteriorly.  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  I saw her on April 26, 2018. She is monitored by Dr. Ferd [name] at [facility] at six-month intervals for her type 1 diabetes mellitus.  She brought with her complete set of laboratory from March 28, 2018.  Of note, her lipids had increased and her total cholesterol was 227 with an LDL cholesterol of 126, HDL cholesterol at 87 and triglycerides at 70.  Laboratory was otherwise normal except there was very mild urine microalbumin.  She continues to be on pravastatin at 20 mg amlodipine 5 mg, benazepril/HCTZ 10/12.5 mg in addition to her levothyroxine 112 mcg and Humalog insulin via pump.  She denies chest pain, PND, orthopnea, palpitations and presyncope or syncope.    I evaluated her in a telemedicine visit on Jul 19, 2019.  Her blood pressure was controlled on benazepril HCT 10/12.5.  She was tolerating rosuvastatin which I had changed from pravastatin previously.  She continues to be on levothyroxine for hypothyroidism.  When I saw her on May 13, 2021 she remained cardiac stable.  She has undergone laboratory at [facility].  She specifically denies chest pain shortness of breath or dizziness.  Hemoglobin A1c was 7.4.  She continues to be on amlodipine 5 mg, benazepril HCT 10/12.5 mg, levothyroxine 125 mcg in addition to rosuvastatin 10 mg.  She is on Humalog insulin in the insulin pump.  During that evaluation, I recommended that in 2024 she undergo a 5-year follow-up echo Doppler evaluation.  I last saw her on May 20, 2022.  At that time she remained stable.    Unfortunately Dr. Daron [name] is now deceased and she is followed by Dr. Deforest [name] at [facility] for her diabetes.  On  May 14, 2022 she underwent 5-year follow-up echo Doppler study which showed EF at 50 to 55%.  There were no wall motion abnormalities.  She had normal diastolic parameters.  There was mild mitral valve regurgitation.  She had normal pulmonary artery systolic pressure.  Aortic and tricuspid valves were normal.  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  She has not had recent laboratory.    Since I last saw her, she has continued to do well.  She is exercising at least 6 to 7 days/week doing at least 30 minutes of cardio.  She received a new insulin pump approximately 1 year ago.  She denies any chest pain or shortness of breath or awareness of palpitations.  She is on amlodipine 5 mg, benazepril/HCTZ 10/12.5 mg daily for blood pressure control.  She is on levothyroxine 100 mcg  for hypothyroidism.  She takes aspirin 81 mg daily.  She is on rosuvastatin 10 mg for cholesterol.  She presents for for yearly evaluation.   Past Medical History:  Diagnosis Date   Diabetes mellitus    has insulin pump   Endocervical polyp    Heart murmur    10 years ago   Hyperlipidemia    Hypertension    Hypothyroidism    Increased blood pressure (not hypertension)    Oligomenorrhea    Thyroid disease    Type 1 diabetes mellitus (HCC)    Uterine enlargement    Uterine fibroid     Past Surgical History:  Procedure Laterality Date   CESAREAN SECTION     2021   MYOMECTOMY ABDOMINAL APPROACH     2000    Current Medications: Outpatient Medications Prior to Visit  Medication Sig Dispense Refill   amLODipine (NORVASC) 5 MG tablet TAKE 1 TABLET BY MOUTH EVERY DAY 90 tablet 3   aspirin 81 MG tablet Take 81 mg by mouth daily.       benazepril-hydrochlorthiazide (LOTENSIN HCT) 10-12.5 MG per tablet Take 1 tablet by mouth Twice daily.     insulin lispro (HUMALOG) 100 UNIT/ML KwikPen      levothyroxine (SYNTHROID) 100 MCG tablet Take 100 mcg by mouth daily before breakfast.      Multiple Vitamins-Minerals (MULTIVITAMIN WITH MINERALS) tablet Take 1 tablet by mouth daily.       ONETOUCH ULTRA test strip TEST 8 TIMES DAILY. ONE TOUCH ULTRA TEST STRIPS E10.9     rosuvastatin (CRESTOR) 10 MG tablet TAKE 1 TABLET BY MOUTH EVERY DAY 90 tablet 3   glucose blood (ONETOUCH ULTRA) test strip TEST 8 TIMES DAILY. ONE TOUCH ULTRA TEST STRIPS E10.9     No facility-administered medications prior to visit.     Allergies:   Patient has no known allergies.   Social History   Socioeconomic History   Marital status: Married    Spouse name: Not on file   Number of children: Not on file   Years of education: Not on file   Highest education level: Not on file  Occupational History   Not on file  Tobacco Use   Smoking status: Never   Smokeless tobacco: Never  Vaping Use   Vaping status: Never Used  Substance and Sexual Activity   Alcohol use: No   Drug use: No   Sexual activity: Yes    Birth control/protection: Post-menopausal  Other Topics Concern   Not on file  Social History Narrative   Not on file   Social Drivers of Health   Financial Resource Strain: Low Risk  (11/19/2022)   Received from [facility]   Overall Financial Resource Strain (CARDIA)    Difficulty of Paying Living Expenses: Not hard at all  Food Insecurity: No Food Insecurity (11/19/2022)   Received from [facility]   Hunger Vital Sign    Worried About Running Out of Food in the Last Year: Never true    Ran Out of Food in the Last Year: Never true  Transportation Needs: No Transportation Needs (11/19/2022)   Received from [facility] - [facility]    In the past 12 months, has lack of transportation kept you from medical appointments or from getting medications?: No    Lack of Transportation (Non-Medical): No  Physical Activity: Sufficiently Active (08/10/2020)   Received from [facility], [facility]  System  Exercise Vital Sign    Days of Exercise per Week: 7 days    Minutes of Exercise per Session: 40 min  Stress: No Stress Concern Present (08/10/2020)   Received from [facility], [facility]   [employer] of Occupational Health - Occupational Stress Questionnaire    Feeling of Stress : Only a little  Social Connections: Not on file    Socially, 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  There is no tobacco history.  She very rarely has a glass of wine.  She does exercise daily, doing elliptical, running or walking 7 days per week for at least 30 minutes a session.  She also uses weight machine 2 days per week.  Family History:  The patient's family history includes Colon cancer in her paternal aunt; Diabetes in her father, maternal grandfather, paternal grandfather, and sister; Hypertension in her father and sister; Hypothyroidism in her sister.  .  She has a twin sister who also has type 1 diabetes mellitus, hypertension and hypothyroidism.  Her father died at age 36 and had diabetes, hypertension, and suffered a heart attack at age 56.  She has a daughter age 31.  ROS General: Negative; No fevers, chills, or night sweats;  HEENT: Negative; No changes in vision or hearing, sinus congestion, difficulty swallowing Pulmonary: Negative; No cough, wheezing, shortness of breath, hemoptysis Cardiovascular: no episodes of dizziness or chest pain. GI: Negative; No nausea, vomiting, diarrhea, or abdominal pain GU: Negative; No dysuria, hematuria, or difficulty voiding Musculoskeletal: Negative; no myalgias, joint pain, or weakness Hematologic/Oncology: Negative; no easy bruising, bleeding Endocrine: Type 1 diabetes mellitus Neuro: Negative; no changes in balance, headaches Skin: Negative; No rashes or skin lesions Psychiatric: Negative; No behavioral problems, depression Sleep: Negative; No snoring, daytime sleepiness,  hypersomnolence, bruxism, restless legs, hypnogognic hallucinations, no cataplexy Other comprehensive 14 point system review is negative.   PHYSICAL EXAM:   VS:  BP (!) 142/78   Pulse 69   Ht 5' 3.5" (1.613 m)   Wt 129 lb (58.5 kg)   SpO2 99%   BMI 22.49 kg/m     Repeat blood pressure by me was 138/76  Wt Readings from Last 3 Encounters:  05/26/23 129 lb (58.5 kg)  05/20/22 127 lb 12.8 oz (58 kg)  07/07/21 125 lb 3.2 oz (56.8 kg)     General: Alert, oriented, no distress.  Skin: normal turgor, no rashes, warm and dry HEENT: Normocephalic, atraumatic. Pupils equal round and reactive to light; sclera anicteric; extraocular muscles intact;  Nose without nasal septal hypertrophy Mouth/Parynx benign; Mallinpatti scale 2/3 Neck: No JVD, no carotid bruits; normal carotid upstroke Lungs: clear to ausculatation and percussion; no wheezing or rales Chest wall: without tenderness to palpitation Heart: PMI not displaced, RRR, s1 s2 normal, 1-2/6 systolic murmur, no diastolic murmur, no rubs, gallops, thrills, or heaves Abdomen: soft, nontender; no hepatosplenomehaly, BS+; abdominal aorta nontender and not dilated by palpation. Back: no CVA tenderness Pulses 2+ Musculoskeletal: full range of motion, normal strength, no joint deformities Extremities: no clubbing cyanosis or edema, Homan's sign negative  Neurologic: grossly nonfocal; Cranial nerves grossly wnl Psychologic: Normal mood and affect     Studies/Labs Reviewed:   EKG Interpretation Date/Time:  Thursday May 26 2023 11:57:13 EDT Ventricular Rate:  69 PR Interval:  144 QRS Duration:  118 QT Interval:  440 QTC Calculation: 471 R Axis:   17  Text Interpretation: Normal sinus rhythm Minimal voltage  criteria for LVH, may be normal variant ( Cornell product ) Cannot rule out Anteroseptal infarct , age undetermined When compared with ECG of 27-May-2021 13:56, PREVIOUS ECG IS PRESENT Confirmed by Brittney [name] ([id number]) on  05/26/2023 12:57:13 PM    May 20, 2022 ECG (independently read by me): NSR at 74, PRWP V1-3, LBBB                                                                     May 13, 2021 ECG (independently read by me): Normal sinus rhythm at 78 bpm.  Incomplete left bundle branch block.  Left axis deviation.  QTc interval 474 ms.  April 26, 2018 ECG (independently read by me): Normal sinus rhythm at 81 bpm.  Left bundle branch block with repolarization changes.  QTc interval 492 ms.  Jun 28 2016 ECG (independently read by me):  Normal sinus rhythm at 77 bpm.  Left bundle branch block with repolarization changes.  PR interval 148 ms; QTc interval 454 ms.  Recent Labs:    Latest Ref Rng & Units 05/27/2022    8:29 AM 05/27/2021    2:28 PM 10/06/2018    8:25 AM  BMP  Glucose 70 - 99 mg/dL 952  841  324   BUN 8 - 27 mg/dL 16  16  19    Creatinine 0.57 - 1.00 mg/dL 4.01  0.27  2.53   BUN/Creat Ratio 12 - 28 15   16    Sodium 134 - 144 mmol/L 133  136  139   Potassium 3.5 - 5.2 mmol/L 4.8  3.4  4.0   Chloride 96 - 106 mmol/L 94  103  97   CO2 20 - 29 mmol/L 22  21  24    Calcium 8.7 - 10.3 mg/dL 9.9  9.3  66.4         Latest Ref Rng & Units 05/27/2022    8:29 AM 05/27/2021    2:59 PM 10/06/2018    8:25 AM  Hepatic Function  Total Protein 6.0 - 8.5 g/dL 7.2  6.1  7.1   Albumin 3.9 - 4.9 g/dL 4.7  3.7  4.9   AST 0 - 40 IU/L 29  30  31    ALT 0 - 32 IU/L 25  30  24    Alk Phosphatase 44 - 121 IU/L 73  53  76   Total Bilirubin 0.0 - 1.2 mg/dL 0.6  0.3  0.4   Bilirubin, Direct 0.0 - 0.2 mg/dL  <4.0         Latest Ref Rng & Units 05/27/2022    8:29 AM 05/27/2021    2:28 PM 06/17/2016    2:26 PM  CBC  WBC 3.4 - 10.8 x10E3/uL 5.9  19.9  17.8   Hemoglobin 11.1 - 15.9 g/dL 34.7  42.5  95.6   Hematocrit 34.0 - 46.6 % 42.5  45.7  47.1   Platelets 150 - 450 x10E3/uL 230  222  217    Lab Results  Component Value Date   MCV 91 05/27/2022   MCV 90.7 05/27/2021   MCV 88.9 06/17/2016   Lab Results   Component Value Date   TSH 1.480 02/07/2017   No results found for: "HGBA1C"   BNP No results  found for: "BNP"  ProBNP No results found for: "PROBNP"   Lipid Panel     Component Value Date/Time   CHOL 175 05/27/2022 0829   TRIG 67 05/27/2022 0829   HDL 89 05/27/2022 0829   CHOLHDL 2.0 05/27/2022 0829   CHOLHDL 2.2 06/21/2016 0815   VLDL 12 06/21/2016 0815   LDLCALC 73 05/27/2022 0829   LDLDIRECT 115.1 02/03/2010 0835     RADIOLOGY: No results found.   Additional studies/ records that were reviewed today include:  I reviewed the patient's ER evaluation in detail as well as laboratory. Since her initial visit, I reviewed an echo Doppler study from 11/14/2009 as well as her most recent echo, nuclear stress test, and laboratory.  I reviewed the laboratory from March 28, 2018 done at [facility].  ECHO: 05/14/2022  1. Left ventricular ejection fraction, by estimation, is 50 to 55%. The  left ventricle has low normal function. The left ventricle has no regional  wall motion abnormalities. Left ventricular diastolic parameters were  normal.   2. Right ventricular systolic function is normal. The right ventricular  size is normal. There is normal pulmonary artery systolic pressure.   3. The mitral valve is normal in structure. Mild mitral valve  regurgitation. No evidence of mitral stenosis.   4. The aortic valve is normal in structure. Aortic valve regurgitation is  trivial. No aortic stenosis is present.   5. The inferior vena cava is normal in size with greater than 50%  respiratory variability, suggesting right atrial pressure of 3 mmHg.    ASSESSMENT:    1. Essential hypertension   2. Hyperlipidemia with target LDL less than 70   3. Type 1 diabetes mellitus without complication (HCC)   4. LBBB (left bundle branch block)   5. Nonrheumatic mitral valve regurgitation   6. Acquired hypothyroidism     PLAN:  Brittney [name] is a 63 year old female who has a  history of type 1 diabetes mellitus since age 13 and has been on insulin pump since 1999.  She has a longstanding history of hypertension, hyperlipidemia, and hypothyroidism.  When I initially saw her in 2018 she had  developed an episode of lightheadedness which seemed to occur after she had inadvertently walked through a nest of bugs.  She noticed itching and pruritus in the possibility of transient associated vasodilation in etiology of her event.  At the time of her evaluation, she was not hypoglycemic and she did not have orthostatic hypotension.  She was found to have a new left bundle branch block which was new compared to her 2011 ECG.when I initially saw her, she was hypertensive and I recommended initial therapy with amlodipine and started her in a very low dose of 2.5 mg.  When I saw her in follow-up several weeks later amlodipine was titrated to 5 mg for more optimal blood pressure control.  Her echo Doppler study has shown mild mitral valve systolic bowing without definitive prolapse with eccentrically directed mitral regurgitation.  She has documented nonobstructive subvalvular ridge in the LVOT track which has not changed on her echo of 2019 from her prior study.  Presently, she feels well and is asymptomatic.  Her blood pressure has been controlled on her current regimen of amlodipine 5 mg, benazepril HCT 10/12.5 mg, and she continues to be on levothyroxine 100 mcg for hypothyroidism and her insulin pump.  She is on rosuvastatin 10 mg for hyperlipidemia.  She has not had recent laboratory and I will have  recommended we obtain a comprehensive metabolic panel, CBC, fasting lipid panel and will also assess LP(a).  She will be seeing Dr. Anice Paganini at [facility] for endocrinology who will be following her thyroid and diabetic regimen.  Her most recent echo Doppler study showed EF at 50 to 55%.  There were no wall motion abnormalities and she had normal diastolic parameters.  There was mild mitral regurgitation.   On this echo there was no mention of her previous nonobstructive subvalvular ridge in the LVOT track noted on prior studies.  She has continued to be asymptomatic and exercises at least 6 to 7 days/week doing at least 30 minutes of cardiac.  She also does some resistance training.  Blood pressure today is stable on her regimen of amlodipine 5 mg of benazepril HCT 10/12.5 mg.  She will monitor her blood pressure.  Often times in the morning she states her blood pressure is 120 systolic.  She continues to be on levothyroxine 100 mcg for hypothyroidism.  She is on rosuvastatin 10 mg for hyperlipidemia.  She has not had recent laboratory.  Laboratory will be checked in the fasting state.  In April 2024 LDL cholesterol was 73.  LP(a) was normal at 9.1.  Total cholesterol was 175.  I discussed with her my plans for upcoming retirement in several months.  I will transition her to the care of Dr. Epifanio Lesches and we will schedule her a 1 year evaluation with him or sooner as needed.    Medication Adjustments/Labs and Tests Ordered: Current medicines are reviewed at length with the patient today.  Concerns regarding medicines are outlined above.  Medication changes, Labs and Tests ordered today are listed in the Patient Instructions below.  Patient Instructions  Medication Instructions:  No medication changes were made during today's visit.  *If you need a refill on your cardiac medications before your next appointment, please call your pharmacy*   Lab Work: No labs were ordered during today's visit.  If you have labs (blood work) drawn today and your tests are completely normal, you will receive your results only by: MyChart Message (if you have MyChart) OR A paper copy in the mail If you have any lab test that is abnormal or we need to change your treatment, we will call you to review the results.   Testing/Procedures: No procedures were ordered during today's visit.    Follow-Up: At [facility], you and your health needs are our priority.  As part of our continuing mission to provide you with exceptional heart care, we have created designated Provider Care Teams.  These Care Teams include your primary Cardiologist (physician) and Advanced Practice Providers (APPs -  Physician Assistants and Nurse Practitioners) who all work together to provide you with the care you need, when you need it.  We recommend signing up for the patient portal called "MyChart".  Sign up information is provided on this After Visit Summary.  MyChart is used to connect with patients for Virtual Visits (Telemedicine).  Patients are able to view lab/test results, encounter notes, upcoming appointments, etc.  Non-urgent messages can be sent to your provider as well.   To learn more about what you can do with MyChart, go to [email or web address].    Your next appointment:   1 year(s)  Provider:   Dr. Epifanio Lesches     Other Instructions [facility]  [address] [place], [place] [address] OPENING APRIL [phone]       1st  Floor: - Lobby - Registration  - Pharmacy  - Lab - Cafe   2nd Floor: - PV Lab - Diagnostic Testing (echo, CT, nuclear med)   3rd Floor: - Vacant   4th Floor: - TCTS (cardiothoracic surgery) - AFib Clinic - Structural Heart Clinic - Vascular Surgery  - Vascular Ultrasound   5th Floor: - HeartCare Cardiology (general and EP) - Clinical Pharmacy for coumadin, hypertension, lipid, weight-loss medications, and med management appointments      Valet parking services will be available as well.       Thank you for choosing [facility]!    A letter will be mailed to you as a reminder to call the office for your next follow up appointment.    Signed, Brittney Guadalajara, MD, [facility]  05/29/2023 1:21 PM    [facility] [address], [place], [place]  [address] Phone: [phone]

## 2023-06-13 ENCOUNTER — Other Ambulatory Visit: Payer: Self-pay | Admitting: Cardiovascular Disease

## 2024-03-17 IMAGING — DX DG CHEST 1V PORT
1 series · 1 of 1 positions shown · non-contrast
Comparison: Chest radiograph dated June 17, 2016

CLINICAL DATA: Syncope

EXAM:
PORTABLE CHEST 1 VIEW

[chest]
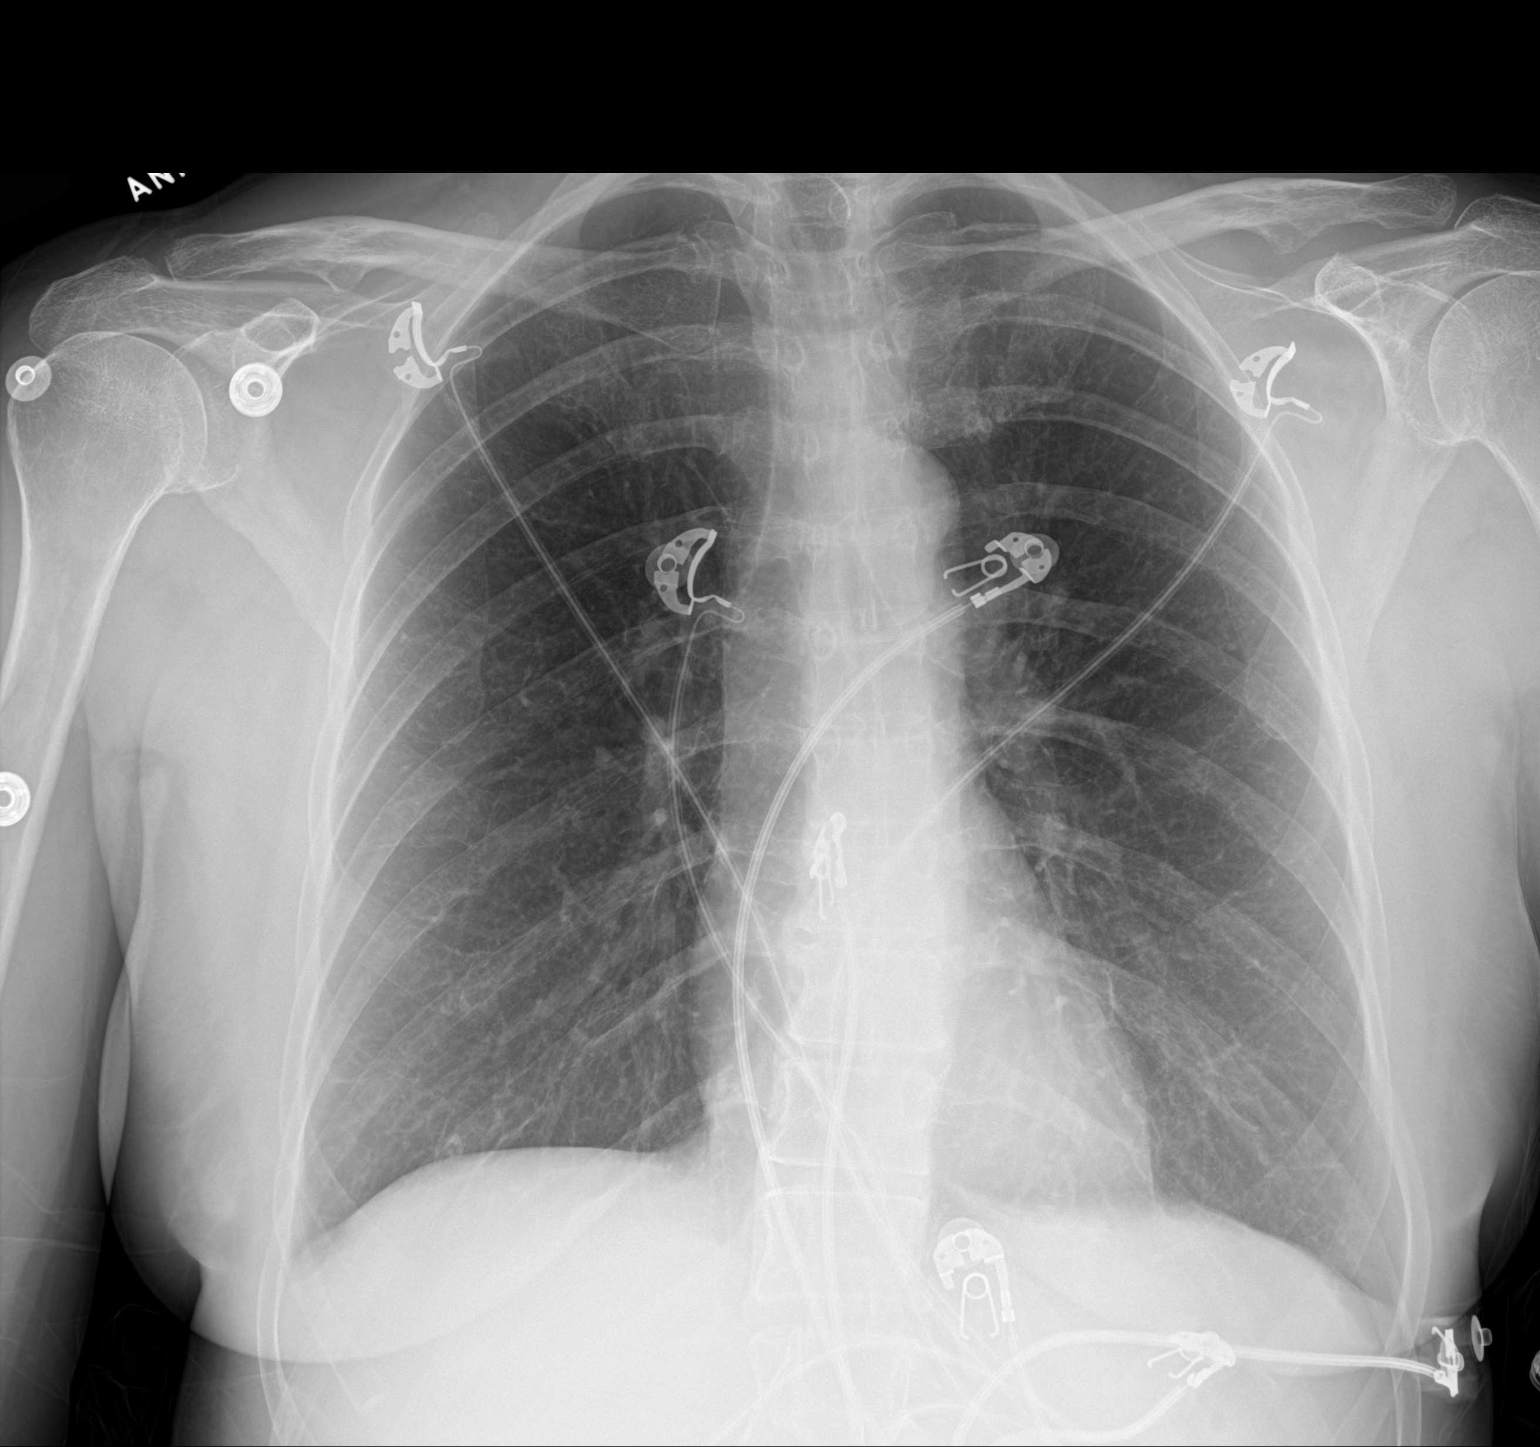

[1 of 1 positions shown; findings below may reference images not displayed]

FINDINGS: The heart size and mediastinal contours are within normal limits.
Both lungs are clear. The visualized skeletal structures are
unremarkable.
IMPRESSION: No active disease.

## 2024-06-07 ENCOUNTER — Ambulatory Visit: Admitting: Cardiology
